# Patient Record
Sex: Male | Born: 1960 | Race: White | Hispanic: No | Smoking: Never smoker
Health system: Southern US, Community
[De-identification: ages and names within clinical notes are randomized; demographics above are authoritative.]

## PROBLEM LIST (undated history)

## (undated) DIAGNOSIS — R519 Headache, unspecified: Secondary | ICD-10-CM

## (undated) DIAGNOSIS — C61 Malignant neoplasm of prostate: Secondary | ICD-10-CM

## (undated) HISTORY — PX: BACK SURGERY: SHX140

## (undated) HISTORY — PX: PROSTATE BIOPSY: SHX241

## (undated) HISTORY — PX: TONSILLECTOMY: SUR1361

---

## 1989-07-04 HISTORY — PX: SURGERY SCROTAL / TESTICULAR: SUR1316

## 2003-01-08 ENCOUNTER — Ambulatory Visit (HOSPITAL_COMMUNITY): Admission: RE | Admit: 2003-01-08 | Discharge: 2003-01-08 | Payer: Self-pay | Admitting: Gastroenterology

## 2003-01-08 ENCOUNTER — Encounter: Payer: Self-pay | Admitting: Gastroenterology

## 2003-02-21 ENCOUNTER — Ambulatory Visit (HOSPITAL_COMMUNITY): Admission: RE | Admit: 2003-02-21 | Discharge: 2003-02-21 | Payer: Self-pay | Admitting: Gastroenterology

## 2003-05-22 ENCOUNTER — Ambulatory Visit (HOSPITAL_COMMUNITY): Admission: RE | Admit: 2003-05-22 | Discharge: 2003-05-23 | Payer: Self-pay | Admitting: Orthopaedic Surgery

## 2008-04-19 ENCOUNTER — Ambulatory Visit (HOSPITAL_COMMUNITY): Admission: RE | Admit: 2008-04-19 | Discharge: 2008-04-20 | Payer: Self-pay | Admitting: Orthopedic Surgery

## 2008-07-04 HISTORY — PX: FRACTURE SURGERY: SHX138

## 2008-08-13 ENCOUNTER — Ambulatory Visit (HOSPITAL_COMMUNITY): Admission: RE | Admit: 2008-08-13 | Discharge: 2008-08-14 | Payer: Self-pay | Admitting: Specialist

## 2009-02-21 ENCOUNTER — Encounter: Admission: RE | Admit: 2009-02-21 | Discharge: 2009-02-21 | Payer: Self-pay | Admitting: Specialist

## 2009-03-12 ENCOUNTER — Encounter (INDEPENDENT_AMBULATORY_CARE_PROVIDER_SITE_OTHER): Payer: Self-pay | Admitting: Specialist

## 2009-03-12 ENCOUNTER — Ambulatory Visit (HOSPITAL_COMMUNITY): Admission: RE | Admit: 2009-03-12 | Discharge: 2009-03-13 | Payer: Self-pay | Admitting: Specialist

## 2010-01-28 IMAGING — CR DG OR PORTABLE SPINE
1 series · 1 of 1 positions shown · non-contrast
Comparison: Single intraoperative view of the lumbar spine at [DATE]
p.m.

CLINICAL DATA: Recurrent HNP at L5 S1.

PORTABLE SPINE

[view not recorded]
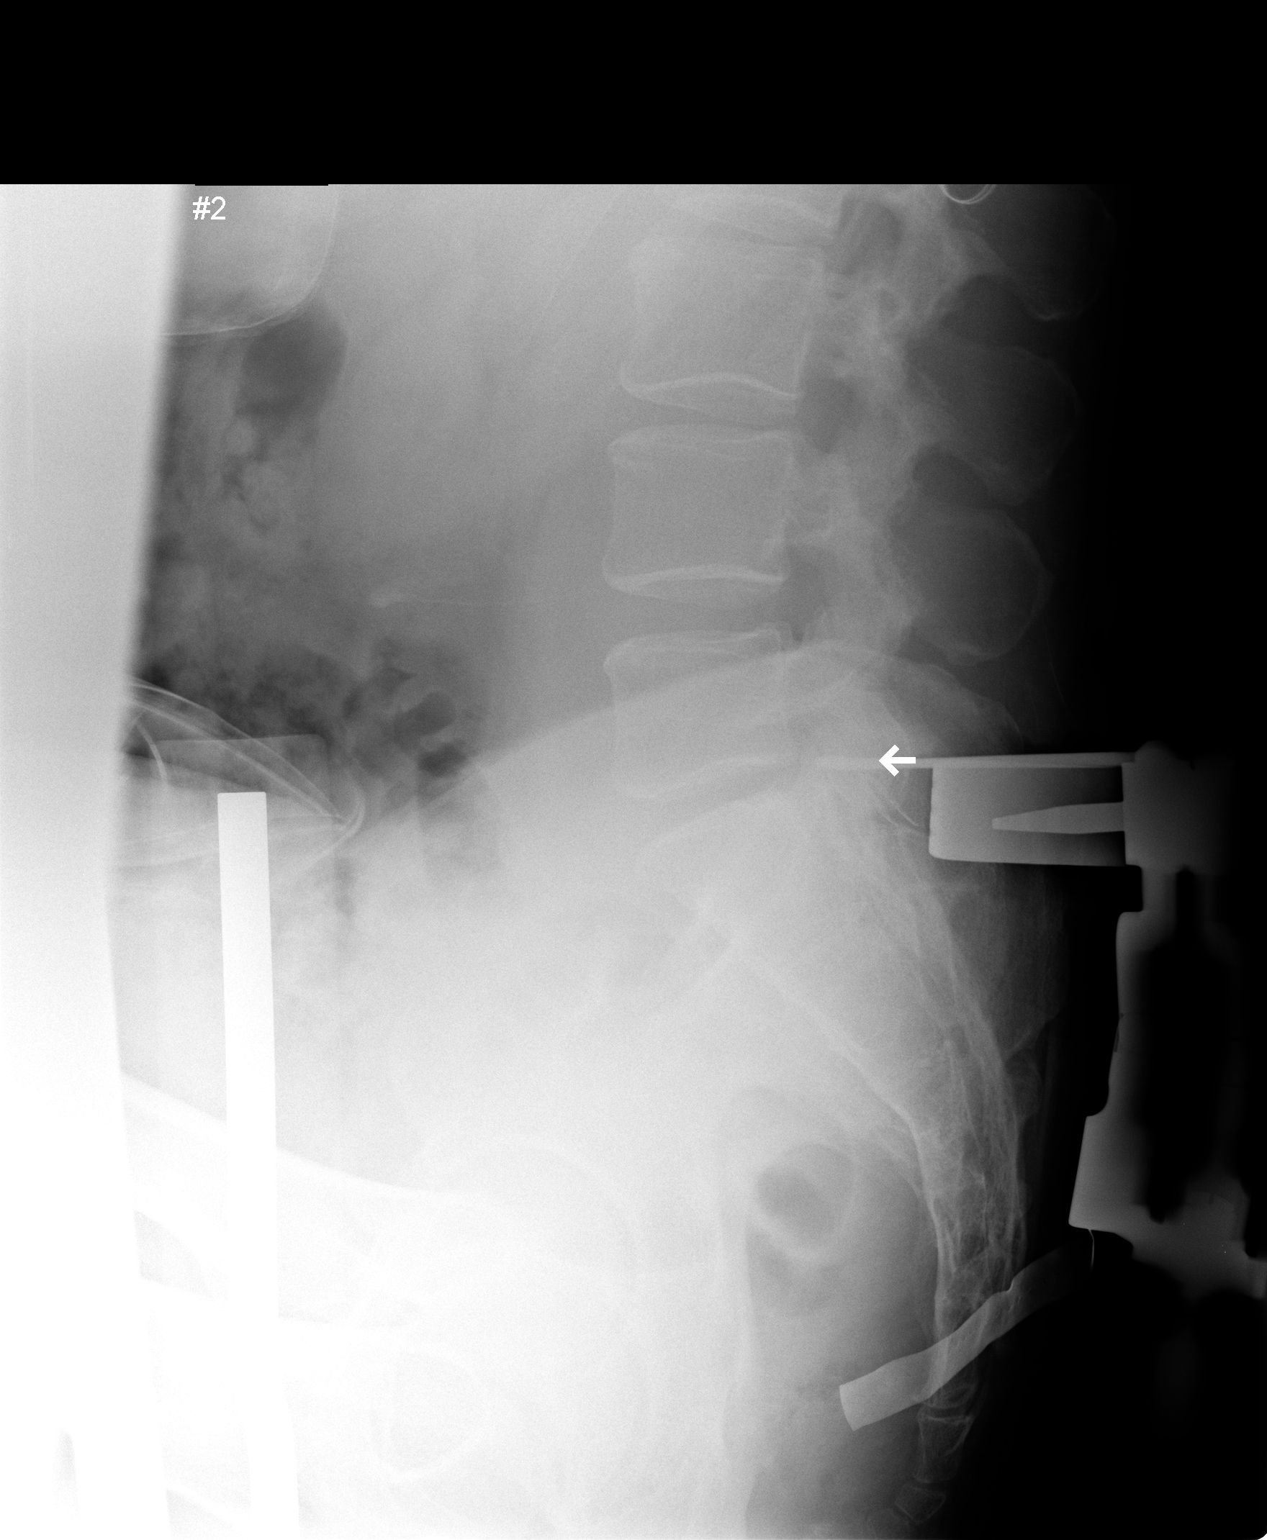

[1 of 1 positions shown; findings below may reference images not displayed]

FINDINGS: A single intraoperative view is noted.  A surgical probe
was directed at the L5-S1 disc level.  Alignment is stable.
IMPRESSION: A surgical probe is directed at the L5-S1 disc level.

## 2010-10-08 LAB — COMPREHENSIVE METABOLIC PANEL
ALT: 72 U/L — ABNORMAL HIGH (ref 0–53)
AST: 43 U/L — ABNORMAL HIGH (ref 0–37)
Albumin: 4.5 g/dL (ref 3.5–5.2)
Alkaline Phosphatase: 84 U/L (ref 39–117)
BUN: 9 mg/dL (ref 6–23)
CO2: 30 mEq/L (ref 19–32)
Calcium: 9.8 mg/dL (ref 8.4–10.5)
Chloride: 103 mEq/L (ref 96–112)
Creatinine, Ser: 0.97 mg/dL (ref 0.4–1.5)
GFR calc Af Amer: >60 mL/min (ref >60)
GFR calc non Af Amer: >60 mL/min (ref >60)
Glucose, Bld: 83 mg/dL (ref 70–99)
Potassium: 4.6 mEq/L (ref 3.5–5.1)
Sodium: 140 mEq/L (ref 135–145)
Total Bilirubin: 0.7 mg/dL (ref 0.3–1.2)
Total Protein: 6.8 g/dL (ref 6.0–8.3)

## 2010-10-08 LAB — CBC
HCT: 48 % (ref 39.0–52.0)
Hemoglobin: 16.6 g/dL (ref 13.0–17.0)
MCHC: 34.6 g/dL (ref 30.0–36.0)
MCV: 100.3 fL — ABNORMAL HIGH (ref 78.0–100.0)
Platelets: 208 10*3/uL (ref 150–400)
RBC: 4.78 MIL/uL (ref 4.22–5.81)
RDW: 13 % (ref 11.5–15.5)
WBC: 6.9 10*3/uL (ref 4.0–10.5)

## 2010-10-08 LAB — URINALYSIS, ROUTINE W REFLEX MICROSCOPIC
Bilirubin Urine: NEGATIVE
Glucose, UA: NEGATIVE mg/dL
Hgb urine dipstick: NEGATIVE
Ketones, ur: NEGATIVE mg/dL
Nitrite: NEGATIVE
Protein, ur: NEGATIVE mg/dL
Specific Gravity, Urine: 1.006 (ref 1.005–1.030)
Urobilinogen, UA: 0.2 mg/dL (ref 0.0–1.0)
pH: 7 (ref 5.0–8.0)

## 2010-10-08 LAB — PROTIME-INR
INR: 1 (ref 0.00–1.49)
Prothrombin Time: 12.7 seconds (ref 11.6–15.2)

## 2010-10-08 LAB — APTT: aPTT: 26 seconds (ref 24–37)

## 2010-10-19 LAB — CBC
HCT: 49.2 % (ref 39.0–52.0)
Hemoglobin: 16.9 g/dL (ref 13.0–17.0)
MCHC: 34.4 g/dL (ref 30.0–36.0)
MCV: 100.5 fL — ABNORMAL HIGH (ref 78.0–100.0)
Platelets: 175 10*3/uL (ref 150–400)
RBC: 4.89 MIL/uL (ref 4.22–5.81)
RDW: 12.9 % (ref 11.5–15.5)
WBC: 5.1 10*3/uL (ref 4.0–10.5)

## 2010-10-19 LAB — URINALYSIS, ROUTINE W REFLEX MICROSCOPIC
Bilirubin Urine: NEGATIVE
Glucose, UA: NEGATIVE mg/dL
Hgb urine dipstick: NEGATIVE
Ketones, ur: NEGATIVE mg/dL
Nitrite: NEGATIVE
Protein, ur: NEGATIVE mg/dL
Specific Gravity, Urine: 1.016 (ref 1.005–1.030)
Urobilinogen, UA: 0.2 mg/dL (ref 0.0–1.0)
pH: 7 (ref 5.0–8.0)

## 2010-10-19 LAB — COMPREHENSIVE METABOLIC PANEL
ALT: 49 U/L (ref 0–53)
AST: 34 U/L (ref 0–37)
Albumin: 4 g/dL (ref 3.5–5.2)
Alkaline Phosphatase: 88 U/L (ref 39–117)
BUN: 9 mg/dL (ref 6–23)
CO2: 27 mEq/L (ref 19–32)
Calcium: 9.2 mg/dL (ref 8.4–10.5)
Chloride: 105 mEq/L (ref 96–112)
Creatinine, Ser: 0.9 mg/dL (ref 0.4–1.5)
GFR calc Af Amer: >60 mL/min (ref >60)
GFR calc non Af Amer: >60 mL/min (ref >60)
Glucose, Bld: 88 mg/dL (ref 70–99)
Potassium: 4.2 mEq/L (ref 3.5–5.1)
Sodium: 140 mEq/L (ref 135–145)
Total Bilirubin: 1.1 mg/dL (ref 0.3–1.2)
Total Protein: 6 g/dL (ref 6.0–8.3)

## 2010-10-19 LAB — APTT: aPTT: 27 seconds (ref 24–37)

## 2010-10-19 LAB — PROTIME-INR
INR: 1 (ref 0.00–1.49)
Prothrombin Time: 13.7 seconds (ref 11.6–15.2)

## 2010-11-16 NOTE — Op Note (Signed)
Derek Fisher, Derek Fisher NO.:  [id number]   MEDICAL RECORD NO.:  [id number]          PATIENT TYPE:  OIB   LOCATION:  0098                         FACILITY:  [facility]   PHYSICIAN:  Jene Every, M.D.    DATE OF BIRTH:  1960/11/17   DATE OF PROCEDURE:  08/13/2008  DATE OF DISCHARGE:                               OPERATIVE REPORT   PREOPERATIVE DIAGNOSES:  Spinal stenosis, herniated nucleus pulposus at  L5-S1, right.   POSTOPERATIVE DIAGNOSES:  Spinal stenosis, herniated nucleus pulposus at  L5-S1, right.   PROCEDURES PERFORMED:  1. Lateral recess decompression, L5-S1.  2. Foraminotomy at S1.  3. Microdiskectomy, L5-S1.   ANESTHESIA:  General.   ASSISTANT:  Roma Schanz, P.A.   BRIEF HISTORY:  [age].  Right lower extremity radiculopathy.  Positive neural tension signs, diminished plantar flexion, numbness in  the S1 nerve root distribution.  MRI indicating disk herniation,  pressing the S1 nerve root, refractory to conservative treatment, is  indicated for decompression.  Risks and benefits discussed, including  bleeding, infection, injury to vascular structures, CSF leakage,  epidural fibrosis, adjacent segment disease, and need for fusion in the  future, anesthetic complications, etc.   TECHNIQUE:  Placed in supine position.  After induction of adequate  anesthesia and 2 grams Kefzol, patient placed prone on the Andrews  frame, all bony prominences well-padded.  Lumbar region was prepped and  draped in usual sterile fashion.  Two 18-gauge spinal needles were  utilized to localize the 5-1 interspace.  This was confirmed with x-ray.  Incision was made over the spinous process of L5-S1.  Subcutaneous  tissue was dissected.  Electrocautery was utilized to achieve  hemostasis.  Dorsolumbar fascia was identified, divided in line with the  skin incision.  Paraspinous muscle elevated from lamina of to 5-1.  Operating microscope was draped and brought onto  the surgical field.  Intraoperative radiograph obtained with a Penfield 4 in the interlaminar  space.  Hemilaminotomy of the cephalad edge of S1 was performed with a 2-  mm Kerrison, detaching ligamentum flavum, protecting neural elements  with a neural patty.  Ligamentum flavum removed from the interspace.  Severe lateral recess stenosis, multifactorial, was noted.  Lateral  recess was decompressed to the medial border pedicle utilizing a 2-mm  Kerrison.  Foraminotomy of S1 was performed.  The S1 nerve was gently  mobilized medially.  A focal HNP was noted.  Annulotomy was performed.  Copious portion of disk material was removed from the disk space with  straight and upbiting pituitary, further mobilized with a hockey stick.  Full diskectomy of herniated material was performed.  There was a least  1 cm of excursion of the S1 nerve root following decompression.  We  checked beneath the thecal sac, the  axilla of the root and shoulder of  the root at the foramen of L5-S1.  There was no disk herniation with  compression of both nerve roots.  Hockey stick probe passed freely up to  the foramen of S1 and 5.  Disk space laminotomy was copiously irrigated  with antibiotic irrigation and inspection  revealed no CSF leakage or  active bleed.   McCullough retractor was removed.  Paraspinous muscles were irrigated  with no evidence of active bleeding.  Thrombin-soaked Gelfoam was placed  in the laminotomy defect.  Dorsolumbar fascia reapproximated with 0  Vicryl simple sutures, subcu with 2-0 Vicryl simple sutures.  Skin was  reapproximated with 4-0 subcuticular Prolene.  Wound reinforced with  Steri-Strips.  Sterile dressing applied, placed supine on hospital bed,  extubated without difficulty and transported to the recovery room in  satisfactory condition.   The patient tolerated the procedure well and there were no  complications.      Jene Every, M.D.  Electronically Signed      JB/MEDQ  D:  08/13/2008  T:  08/13/2008  Job:  [id number]

## 2010-11-16 NOTE — Op Note (Signed)
NAMEMRK, BUZBY NO.:  [id number]   MEDICAL RECORD NO.:  [id number]          PATIENT TYPE:  OIB   LOCATION:  5021                         FACILITY:  [facility]   PHYSICIAN:  Dionne Ano. Gramig, M.D.DATE OF BIRTH:  1961/04/02   DATE OF PROCEDURE:  DATE OF DISCHARGE:                               OPERATIVE REPORT   PREOPERATIVE DIAGNOSES:  Fracture radial head, right elbow with  displacement and articular incongruity and noted associated lateral  ulnar collateral ligament injury.   POSTOPERATIVE DIAGNOSES:  Fracture radial head, right elbow with  displacement and articular incongruity and noted associated lateral  ulnar collateral ligament injury with noted capitellum chondral injury.   SURGICAL PROCEDURES PERFORMED:  1. Open reduction and internal fixation, radial head fracture, right      elbow.  2. Lateral ulnar collateral ligament repair, right elbow.  3. Stress radiography.  4. Arthrotomy with synovectomy and debridement of the capitellum      chondral injury about the elbow.   SURGEON:  Dionne Ano. Amanda Pea, MD   ASSISTANT:  Karie Chimera, PA-C   COMPLICATIONS:  None.   ANESTHESIA:  General.   INDICATIONS FOR THE PROCEDURE:  The patient is a very pleasant male who  presents with the above-mentioned diagnoses.  I have counseled him in  regards to risks and benefits of surgery including risk of infection,  bleeding, anesthesia, damage to normal structures, and failure of  surgery to accomplish its intended goals of relieving symptoms and  restoring function.  With this in mind, he desires to proceed.  All  questions have been encouraged and answered preoperatively.   OPERATIVE PROCEDURE:  The patient was seen by myself, anesthesia.  He  was counseled in the preop area, arm was marked, consent was signed.  All questions were encouraged and answered.  He was taken to operative  suite and underwent smooth induction of general anesthesia.  Following  this, he was placed appropriately, prepped and draped in a sterile  fashion, Betadine scrub and paint.  His arm initially had loss of 30  degrees of extension due to hemarthrosis in the joint.  I did a fluoro  of the arm at this point in time.  I did not see any coronoid or ulna  shaft fracture.  I performed prepping and draping in the usual sterile  fashion.  Sterile field was secured and Kocher incision was made.  This  was done at 250 mmHg of tourniquet control.  Dissection was carried  down.  A tear about the lateral ulnar collateral ligament was noted.  This was later repaired.  We dissected down carefully taking care to  pronate the arm to avoid radial nerve damage and then accessed the  fracture site through an arthrotomy.  I immediately encountered  articular scuffing about the capitellum.  This was debrided.  The  chondral injury was debrided about the capitellum without difficulty.  Synovectomy and removal of the hemarthrosis was performed.  I irrigated  very copiously and removed all bloody clot from the region.  Following  this, I then performed a reduction with a combination of  Freer Web designer.  The patient had reduction of the radial head  fracture accomplished without difficulty followed by fixation with 2  micro Acutrak screws.  Standard AO technique was used for placement  including guide pin placement followed by over reaming the proximal  cortex and then placing the screw.  The patient had excellent purchase,  the screw sat nicely, and this restored his anatomy to excellent  anatomic integrity.  I then irrigated out copiously the joint of greater  than 2 L of saline and noted that the capitellum, radial head fit looked  excellent.  The convexity and concavity and articular apposition  appeared to be excellent.  I then performed a lateral ulnar collateral  ligament repair with FiberWire suture.  A combination of interrupted and  baseball stitch  was used imbricate this region.  He was stress tested  and looked excellent and I was pleased with this.  We then closed the  wound with a running stitch in the fascia followed by subcu being closed  with Vicryl and skin edges with Prolene.  Sensorcaine without  epinephrine was placed in the wound for postop analgesia.  He was placed  in a long-arm splint.  He tolerated the procedure well and there were no  complicating features.   He is going to be monitored quite closely in the postop.  I have placed  him on Indocin as a heterotopic bone prophylaxis and we will plan for  pain management, antibiotics according to our postop protocol, and ask  him to return to the office 10 days after the initial surgery for suture  removal and will graduate him to a therapy program.  We will begin a  gentle interval range of motion.  I want a stress flexion and extension  and make sure that he gets of quickly to moving the elbow.  I have  discussed with him the relevant do's and do not's, etc., and discussed  all issues with his family (wife, Vikki Ports).      Dionne Ano. Amanda Pea, M.D.  Electronically Signed     WMG/MEDQ  D:  04/19/2008  T:  04/20/2008  Job:  [id number]

## 2010-11-19 NOTE — Op Note (Signed)
NAME:  Derek Fisher, Derek Fisher                        ACCOUNT NO.:  [id number]   MEDICAL RECORD NO.:  [id number]                   PATIENT TYPE:  OIB   LOCATION:  5021                                 FACILITY:  [facility]   PHYSICIAN:  Sharolyn Douglas, M.D.                     DATE OF BIRTH:  1960/08/14   DATE OF PROCEDURE:  05/22/2003  DATE OF DISCHARGE:  05/23/2003                                 OPERATIVE REPORT   PREOPERATIVE DIAGNOSIS:  Cervical spondylosis and degenerative disk disease,  C6-7.   POSTOPERATIVE DIAGNOSIS:  Cervical spondylosis and degenerative disk  disease, C6-7.   PROCEDURE:  1. Anterior cervical diskectomy C6-7.  2. Anterior cervical arthrodesis with placement of an 8-mm NuVasive     allograft prosthesis spacer packed with local autologous bone graft.  3. Anterior cervical plating utilizing the Trinica system at C6-7.   SURGEON:  Sharolyn Douglas, M.D.   ASSISTANT:  [name], P.A.   ANESTHESIA:  General endotracheal anesthesia.   COMPLICATIONS:  None.   INDICATIONS FOR PROCEDURE:  The patient is a 50 year old male with a long  history of progressively worsening neck  and right shoulder pain. Plain  radiographs show severe degenerative changes, most pronounced at C6-7 with  disk space narrowing and large osteophyte formation with local kyphosis.  There are facet degenerative changes at C4-5 and C5-6. His MRI scan again  demonstrated multilevel degenerative changes with severe  disk  space  narrowing and osteophyte formation at C6-7. There was a broad-based disk  protrusion which is in the right posterolateral position  with moderate  spinal stenosis and slight deformity of  the cord. There is severe foraminal  narrowing right greater than left. Because of his refractory symptoms  unresponsive to conservative treatment, he has elected to undergo ACDF at C6-  7 with hopes of improving  his symptoms. He understands that he has  significant degenerative changes at the  adjacent segments which may be an  issue in the future.   DESCRIPTION OF PROCEDURE:  The patient was properly identified in the  holding area  and taken to the operating room. He underwent  general  endotracheal anesthesia without difficulty. He was given prophylactic IV  antibiotics. He was carefully positioned on the operating table with the  Mayfield head  rest. His neck  was placed in slight extension. Then 5 pounds  of halter traction was applied. The neck was prepped and draped in the usual  sterile fashion.   A 4-cm incision was made on the left side at the level of the cricoid  cartilage. Dissection was carried down sharply through the platysma. The  interval between the SCM and the strap muscles was developed down to the  prevertebral space. The C6-7 level was easily identified  by the large  anterior osteophytes. A spinal needle  was placed and intraoperative x-ray  confirmed our location. The esophagus,  trachea and carotid sheath were  identified  and protected at all times. The longus coli muscle was elevated  out over the C6-7 disk space bilaterally. The deep Shadowline retractor was  placed.   A Leksell rongeur was used to remove the very large anterior osteophyte. The  diskectomy was carried back to the posterior longitudinal ligament. The disk  material was very degenerative. There were large uncovertebral spurs and the  disk  space was quite narrowed. Caspar distraction pins were placed and  distraction was applied across the C6-7 interspace.   The microscope was draped and brought into the field. High bur  use removed  the cartilaginous endplate as well as take down the uncovertebral joints. A  2-mm Kerrison was used to undercut the vertebral margins at C6 and 7.  Foraminotomies were carried out bilaterally. We found the foramen to be  quite tight secondary to osteophyte overgrowth. A blunt probe was used to  confirm the foramen was patent. Bleeding was controlled  with bipolar  electrocautery and Gelfoam.   We then placed an 8-mm NuVasive allograft prosthesis spacer which had been  packed with local autogenous bone graft collected from the osteophytes as  well as bur shavings. The graft  was carefully countersunk 2 mm. We  confirmed there was space posteriorly with the blunt probe. We then placed  the 26-mm Trinica plate with four 14-mm screws. We had excellent screw  purchase. We confirmed the locking mechanism was engaged.   The wound was irrigated. The esophagus, trachea and carotid sheath were  inspected. There were no apparent injuries. A deep Penrose drain was placed.  The platysma was closed with a 2-0 Vicryl followed  by a 3-0 Vicryl in the  subcutaneous layer and then a running 4-0 Vicryl subcuticular suture  to  reapproximate the skin edges. Benzoin and Steri-Strips were placed. A  sterile dressing was applied. A soft collar was placed.   The patient was extubated without difficulty and transported to the recovery  room in stable condition. He was able to move his upper  and lower  extremities.                                               Sharolyn Douglas, M.D.    MC/MEDQ  D:  05/22/2003  T:  05/23/2003  Job:  [id number]

## 2010-11-19 NOTE — Op Note (Signed)
   NAME:  Derek Fisher, Derek Fisher                        ACCOUNT NO.:  [id number]   MEDICAL RECORD NO.:  [id number]                   PATIENT TYPE:  AMB   LOCATION:  ENDO                                 FACILITY:  [facility]   PHYSICIAN:  Anselmo Rod, M.D.               DATE OF BIRTH:  [birth year]   DATE OF PROCEDURE:  02/21/2003  DATE OF DISCHARGE:                                 OPERATIVE REPORT   PROCEDURE PERFORMED:  Screening colonoscopy.   ENDOSCOPIST:  Anselmo Rod, M.D.   INSTRUMENT USED:  Olympus video colonoscope.   INDICATIONS FOR PROCEDURE:  Family history of colon cancer in a 50 year old  white male, rule out colonic polyps, masses, etc.   PREPROCEDURE PREPARATION:  Informed consent was procured from the patient.  The patient was fasted for eight hours prior to the procedure and prepped  with a bottle of magnesium citrate and a gallon of GoLYTELY the night prior  to the procedure.   PREPROCEDURE PHYSICAL EXAMINATION:  VITAL SIGNS:  The patient with stable  vital signs.  NECK:  Supple.  CHEST:  Clear to auscultation.  CARDIAC:  S1, S2, regular.  ABDOMEN:  Soft with normal bowel sounds.   DESCRIPTION OF THE PROCEDURE:  The patient was placed in the left lateral  decubitus position, sedated with 70 mg of Demerol and 7 mg of Versed  intravenously.  Once the patient was adequately sedated and maintained on  low-flow oxygen and continuous cardiac monitoring, the Olympus video  colonoscope was advanced from the rectum to the cecum and terminal ileum  without difficulty.  The entire exam was normal.  No masses, polyps,  erosions, ulcerations, or diverticula were seen.  Appendiceal orifice and  the ileocecal valve were clearly visualized and photographed.  The terminal  ileum appeared normal.  Retroflexion in the rectum revealed no  abnormalities.   IMPRESSION:  Normal colonoscopy up to the terminal ileum.                  RECOMMENDATIONS:  1. Repeat  colorectal screening in the next five years unless the patient     develops any abnormal symptoms in the     interim.  2. Outpatient follow-up as need arises in the future.  3. Continue on high fiber diet with liberal fluid intake.                                               Anselmo Rod, M.D.    JNM/MEDQ  D:  02/21/2003  T:  02/21/2003  Job:  [id number]   cc:   Gloriajean Dell. Andrey Campanile, M.D.  [address]  [place]  [place] [address]  Fax: [phone]

## 2010-11-19 NOTE — H&P (Signed)
NAME:  Derek Fisher, Derek Fisher NO.:  [id number]   MEDICAL RECORD NO.:  [id number]                   PATIENT TYPE:  OIB   LOCATION:                                       FACILITY:  [facility]   PHYSICIAN:  Sharolyn Douglas, M.D.                     DATE OF BIRTH:  June 18, 1961   DATE OF ADMISSION:  05/22/2003  DATE OF DISCHARGE:                                HISTORY & PHYSICAL   CHIEF COMPLAINT:  Neck and bilateral upper extremity pain and paresthesias,  right worse than left.   HISTORY OF PRESENT ILLNESS:  The patient is a 50 year old male with neck  pain and bilateral upper extremity pain and paresthesias for a number of  years now.  He has failed numerous conservative management including anti-  inflammatory medications, narcotic pain medications, activity modification,  physical therapy, as well as injections.  Unfortunately, he has failed to  improve.  He continues to have pain that limits his activities as well as  quality of life.  Secondary to these findings as well as findings on MRI, it  is felt his best course of management would be an anterior cervical  diskectomy and fusion at C6-7.  Risks and benefits of this procedure were  discussed with the patient by Dr. Sharolyn Douglas as well as myself.  He indicated  understanding and opted to proceed.   ALLERGIES:  No known drug allergies.   MEDICATIONS:  Over-the-counter ibuprofen.   PAST SURGICAL HISTORY:  1. Cyst removed from wrist.  2. Hernia repair.   PAST MEDICAL HISTORY:  History of migraines.   SOCIAL HISTORY:  The patient denies tobacco use.  He has one drink two to  three times per week.  He is married, has two children, ages 4 and 41.  His  wife will be available to help postoperatively.   FAMILY MEDICAL HISTORY:  Father alive at age 65 with diabetes, Parkinson's,  and Alzheimer's.  Mother alive at age 5 and healthy.   REVIEW OF SYSTEMS:  The patient denies any fevers, chills, bleeding  tendencies,  CNS signs, blurred vision, double vision, seizures, headaches,  paralysis.  CARDIOVASCULAR:  Denies chest pain, angina, orthopnea,  claudication, palpitations.  PULMONARY:  Denies shortness of breath,  productive cough, or hemoptysis.  GI:  Denies nausea, vomiting,  constipation, diarrhea, melena, bloody stools.  GU: Denies dysuria,  hematuria, discharge.  MUSCULOSKELETAL:  As per HPI.   PHYSICAL EXAMINATION:  VITAL SIGNS:  Blood pressure 126/76, respirations 16  and unlabored, pulse 80 and regular.  GENERAL:  The patient is a 50 year old white male who is alert and oriented  in no acute distress.  Well-nourished, well-developed, appears stated age.  Pleasant and cooperative with exam.  HEENT:  Head is normocephalic and atraumatic.  Pupils equal, round, and  reactive.  Extraocular movements intact.  Nares patent, pharynx clear.  NECK:  Soft  to palpation.  No lymphadenopathy, thyromegaly, or bruits  appreciated.  CHEST:  Clear to auscultation bilaterally.  No rales, rhonchi, sternal  wheezes, friction rubs.  BREASTS:  Not pertinent and not performed.  HEART:  S1, S2.  Regular rate and rhythm.  No murmurs, gallops, or rubs.  ABDOMEN:  Soft to palpation.  Positive bowel sounds.  Nontender,  nondistended.  No organomegaly noted.  GU:  Not pertinent, not performed.  EXTREMITIES:  The patient's motor function and sensation are grossly intact.  Pulses are intact and symmetric.  SKIN:  Intact without lesions or rashes.   LABORATORY DATA:  X-rays show degenerative disk disease of cervical spine,  worse at C6-7.   IMPRESSION:  C6-7 spondylosis.   PLAN:  Admit to [facility] November 18,2004, for anterior cervical  diskectomy and fusion at C6-7 to be done by Dr. Sharolyn Douglas.      Derek Fisher, P.A.                       Sharolyn Douglas, M.D.    CM/MEDQ  D:  05/22/2003  T:  05/22/2003  Job:  [id number]

## 2011-04-04 LAB — URINALYSIS, ROUTINE W REFLEX MICROSCOPIC
Bilirubin Urine: NEGATIVE
Hgb urine dipstick: NEGATIVE
Ketones, ur: NEGATIVE
Nitrite: NEGATIVE
Specific Gravity, Urine: 1.031 — ABNORMAL HIGH
pH: 7.5

## 2011-04-04 LAB — CBC
HCT: 49.3
Hemoglobin: 16.9
MCV: 100.6 — ABNORMAL HIGH
Platelets: 182
RDW: 12.7
WBC: 4.6

## 2011-04-04 LAB — BASIC METABOLIC PANEL
BUN: 9
Chloride: 108
Glucose, Bld: 83
Potassium: 4.2
Sodium: 140

## 2016-04-14 ENCOUNTER — Other Ambulatory Visit: Payer: Self-pay

## 2016-07-04 HISTORY — PX: COLONOSCOPY: SHX174

## 2017-08-28 DIAGNOSIS — R059 Cough, unspecified: Secondary | ICD-10-CM | POA: Insufficient documentation

## 2017-08-28 DIAGNOSIS — J069 Acute upper respiratory infection, unspecified: Secondary | ICD-10-CM | POA: Insufficient documentation

## 2018-12-25 DIAGNOSIS — M545 Low back pain, unspecified: Secondary | ICD-10-CM | POA: Insufficient documentation

## 2019-10-18 ENCOUNTER — Ambulatory Visit: Payer: Self-pay

## 2019-10-18 ENCOUNTER — Ambulatory Visit: Payer: Self-pay | Admitting: Radiation Oncology

## 2019-11-04 NOTE — Progress Notes (Signed)
GU Location of Tumor / Histology: prostatic adenocarcinoma  If Prostate Cancer, Gleason Score is (3 + 4) and PSA is (5.29). Prostate volume: 40 mL.  Derek Fisher presented      months ago with signs/symptoms of:     Biopsies of prostate (if applicable) revealed:   Past/Anticipated interventions by urology, if any: prostate biopsy, referral to Dr. Tammi Klippel to discuss radiation therapy options, patient leaning toward surgery.  Past/Anticipated interventions by medical oncology, if any: no  Weight changes, if any:   Bowel/Bladder complaints, if any: Reports dysuria.   Nausea/Vomiting, if any: no  Pain issues, if any:    SAFETY ISSUES:  Prior radiation?   Pacemaker/ICD?   Possible current pregnancy? no, male patient  Is the patient on methotrexate? no  Current Complaints / other details:  59 year old male. Divorced. Works in [employer] for [employer]. Former smoker (stopped in 1987).   Leaning toward surgery.

## 2019-11-05 ENCOUNTER — Other Ambulatory Visit: Payer: Self-pay

## 2019-11-05 ENCOUNTER — Telehealth: Payer: Self-pay | Admitting: Radiation Oncology

## 2019-11-05 ENCOUNTER — Ambulatory Visit
Admission: RE | Admit: 2019-11-05 | Discharge: 2019-11-05 | Disposition: A | Discharge status: Home / Self Care | Payer: Commercial Managed Care - PPO | Source: Ambulatory Visit | Attending: Radiation Oncology | Admitting: Radiation Oncology

## 2019-11-05 ENCOUNTER — Encounter: Payer: Self-pay | Admitting: Radiation Oncology

## 2019-11-05 VITALS — Ht 74.0 in | Wt 205.0 lb

## 2019-11-05 DIAGNOSIS — C61 Malignant neoplasm of prostate: Secondary | ICD-10-CM

## 2019-11-05 HISTORY — DX: Malignant neoplasm of prostate: C61

## 2019-11-05 NOTE — Telephone Encounter (Signed)
Patient scheduled for nurse eval at 1230 and consult at 1300 with Dr. Tammi Klippel. Attempted several times to reach patient on his cell and home number. Phoned numbers on referral match numbers in system. Only able to leave a message on the patient's cell phone. Left two voicemail messages spaced approximately ten minutes apart encouraging a call back. Provided patient with my direct number in the voicemail. Informed providers.

## 2019-11-05 NOTE — Progress Notes (Signed)
Radiation Oncology         [phone] ________________________________  Initial Outpatient Consultation - Conducted via Telephone due to current COVID-19 concerns for limiting patient exposure  Name: Derek Fisher MRN: [id number]  Date: 11/05/2019  DOB: 1960/12/16  [id number], No Pcp Per  Alexis Frock, MD   REFERRING PHYSICIAN: Alexis Frock, MD  DIAGNOSIS: 59 y.o. gentleman with Stage T1c adenocarcinoma of the prostate with Gleason score of 3+4, and PSA of 5.29.    ICD-10-CM   1. Malignant neoplasm of prostate ([place])  C61     HISTORY OF PRESENT ILLNESS: Derek Fisher is a 59 y.o. male with a diagnosis of prostate cancer. He has a history of elevated PSA of 4.88 with induration noted on DRE in 12/2012. He was placed on bactrim for a month, and the PSA responded accordingly, returning to normal at 3.19 in 02/2013. Since that time, he has continued in follow up with Dr. Tresa Moore, and his PSA has remained in the normal range between 3-3.5.   However, more recently, his PSA rose to 5.38 in 06/2019 and remained elevated at 5.29 when repeated 08/14/2019.  A digital rectal examination was performed at that time revealing slight right induration without nodularity.  The patient proceeded to transrectal ultrasound with 12 biopsies of the prostate on 09/30/2019.  The prostate volume measured 40 cc.  Out of 12 core biopsies, 3 were positive.  The maximum Gleason score was 3+4, and this was seen in the left apex. Additionally, Gleason 3+3 was seen in the right base (small focus) and right mid lateral.  The patient reviewed the biopsy results with his urologist and he has kindly been referred today for discussion of potential radiation treatment options.  PREVIOUS RADIATION THERAPY: No  PAST MEDICAL HISTORY:  Past Medical History:  Diagnosis Date  . Prostate cancer ([place])       PAST SURGICAL HISTORY: Past Surgical History:  Procedure Laterality Date  . PROSTATE BIOPSY      FAMILY  HISTORY:  Family History  Problem Relation Age of Onset  . Breast cancer Neg Hx   . Prostate cancer Neg Hx   . Pancreatic cancer Neg Hx   . Colon cancer Neg Hx     SOCIAL HISTORY:  Social History   Socioeconomic History  . Marital status: Legally Separated    Spouse name: Not on file  . Number of children: Not on file  . Years of education: Not on file  . Highest education level: Not on file  Occupational History  . Not on file  Tobacco Use  . Smoking status: Never Smoker  . Smokeless tobacco: Never Used  Substance and Sexual Activity  . Alcohol use: Not Currently  . Drug use: Never  . Sexual activity: Yes  Other Topics Concern  . Not on file  Social History Narrative  . Not on file   Social Determinants of Health   Financial Resource Strain:   . Difficulty of Paying Living Expenses:   Food Insecurity:   . Worried About Charity fundraiser in the Last Year:   . Arboriculturist in the Last Year:   Transportation Needs:   . Film/video editor (Medical):   Marland Kitchen Lack of Transportation (Non-Medical):   Physical Activity:   . Days of Exercise per Week:   . Minutes of Exercise per Session:   Stress:   . Feeling of Stress :   Social Connections:   . Frequency of Communication with  Friends and Family:   . Frequency of Social Gatherings with Friends and Family:   . Attends Religious Services:   . Active Member of Clubs or Organizations:   . Attends Archivist Meetings:   Marland Kitchen Marital Status:   Intimate Partner Violence:   . Fear of Current or Ex-Partner:   . Emotionally Abused:   Marland Kitchen Physically Abused:   . Sexually Abused:     ALLERGIES: Patient has no known allergies.  MEDICATIONS:  Current Outpatient Medications  Medication Sig Dispense Refill  . cetirizine (ZYRTEC) 10 MG tablet Take by mouth.    . Loratadine-Pseudoephedrine (CLARITIN-D 12 HOUR PO) Claritin-D    . meloxicam (MOBIC) 15 MG tablet Take 15 mg by mouth daily.    . Naproxen Sodium (ALEVE  PO) Aleve    . tadalafil, PAH, (ADCIRCA) 20 MG tablet SMARTSIG:1 Tablet(s) By Mouth Every 3 Days PRN     No current facility-administered medications for this encounter.    REVIEW OF SYSTEMS:  On review of systems, the patient reports that he is doing well overall. He denies any chest pain, shortness of breath, cough, fevers, chills, night sweats, unintended weight changes. He denies any bowel disturbances, and denies abdominal pain, nausea or vomiting. He denies any new musculoskeletal or joint aches or pains. He reports minimal urinary symptoms without bother. He does have moderate erectile dysfunction, managed with Cialis. A complete review of systems is obtained and is otherwise negative.  PHYSICAL EXAM:  Wt Readings from Last 3 Encounters:  11/05/19 205 lb (93 kg)   Temp Readings from Last 3 Encounters:  No data found for Temp   BP Readings from Last 3 Encounters:  No data found for BP   Pulse Readings from Last 3 Encounters:  No data found for Pulse   Pain Assessment Pain Score: 0-No pain/10  Physical exam not performed in light of telephone consult visit format.   KPS = 100  100 - Normal; no complaints; no evidence of disease. 90   - Able to carry on normal activity; minor signs or symptoms of disease. 80   - Normal activity with effort; some signs or symptoms of disease. 58   - Cares for self; unable to carry on normal activity or to do active work. 60   - Requires occasional assistance, but is able to care for most of his personal needs. 50   - Requires considerable assistance and frequent medical care. 64   - Disabled; requires special care and assistance. 86   - Severely disabled; hospital admission is indicated although death not imminent. 41   - Very sick; hospital admission necessary; active supportive treatment necessary. 10   - Moribund; fatal processes progressing rapidly. 0     - Dead  Karnofsky DA, Abelmann [place], Craver LS and Burchenal [facility] [phone]) The use of  the nitrogen mustards in the palliative treatment of carcinoma: with particular reference to bronchogenic carcinoma Cancer 1 [phone]  LABORATORY DATA:  Lab Results  Component Value Date   WBC 6.9 03/10/2009   HGB 16.6 03/10/2009   HCT 48.0 03/10/2009   MCV 100.3 (H) 03/10/2009   PLT 208 03/10/2009   Lab Results  Component Value Date   NA 140 03/10/2009   K 4.6 03/10/2009   CL 103 03/10/2009   CO2 30 03/10/2009   Lab Results  Component Value Date   ALT 72 (H) 03/10/2009   AST 43 (H) 03/10/2009   ALKPHOS 84 03/10/2009   BILITOT 0.7 03/10/2009  RADIOGRAPHY: No results found.    IMPRESSION/PLAN: This visit was conducted via Telephone to spare the patient unnecessary potential exposure in the healthcare setting during the current COVID-19 pandemic. 1. 59 y.o. gentleman with Stage T1c adenocarcinoma of the prostate with Gleason Score of 3+4, and PSA of 5.29. We discussed the patient's workup and outlined the nature of prostate cancer in this setting. The patient's T stage, Gleason's score, and PSA put him into the favorable intermediate risk group. Accordingly, he is eligible for a variety of potential treatment options including brachytherapy, 5.5 weeks of external radiation, or prostatectomy. We discussed the available radiation techniques, and focused on the details and logistics of delivery. We discussed and outlined the risks, benefits, short and long-term effects associated with radiotherapy and compared and contrasted these with prostatectomy. We discussed the role of SpaceOAR in reducing the rectal toxicity associated with radiotherapy.    The patient focused most of his questions and interest in robotic-assisted laparoscopic radical prostatectomy. We discussed some of the potential advantages of surgery including surgical staging, the availability of salvage radiotherapy to the prostatic fossa, and the confidence associated with immediate biochemical response.  We also  discussed some of the potential proven indications for postoperative radiotherapy including positive margins, extracapsular extension, and seminal vesicle involvement. We talked about some of the other potential findings leading to a recommendation for radiotherapy including a non-zero postoperative PSA and positive lymph nodes. He was encouraged to ask questions that were answered to his stated satisfaction.  At the end of the conversation, the patient is leaning towards prostatectomy but has not made his final decision. We will share our discussion with Dr. Tresa Moore He plans to continue to give further consideration to his options and will meet back with Dr. Tresa Moore for further discussion before making his final decision. We wished him the best, and we look forward to following along in his progress. Of course, we are more than happy to continue to participate in his care should he elect to proceed with radiotherapy now or have any clinical indication in the future.  Given current concerns for patient exposure during the COVID-19 pandemic, this encounter was conducted via telephone. The patient was notified in advance and was offered a MyChart meeting to allow for face to face communication but unfortunately reported that he did not have the appropriate resources/technology to support such a visit and instead preferred to proceed with telephone consult. The patient has given verbal consent for this type of encounter. The time spent during this encounter was 60 minutes. The attendants for this meeting include Tyler Pita MD, Ashlyn Bruning PA-C, [place], and patient, Derek Fisher. During the encounter, Tyler Pita MD, Ashlyn Bruning PA-C, and scribe, Wilburn Mylar were located at [place].  Patient, Derek Fisher was located at home.    Nicholos Johns, PA-C    Tyler Pita, MD  [facility] Direct Dial: [phone]  Fax: [phone] [email or web address]  Skype  LinkedIn  This document serves as a record of services personally performed by Tyler Pita, MD and Freeman Caldron, PA-C. It was created on their behalf by Wilburn Mylar, a trained medical scribe. The creation of this record is based on the scribe's personal observations and the provider's statements to them. This document has been checked and approved by the attending provider.

## 2019-11-05 NOTE — Progress Notes (Signed)
See progress note under physician encounter. 

## 2019-11-12 ENCOUNTER — Encounter: Payer: Self-pay | Admitting: Medical Oncology

## 2019-11-12 NOTE — Progress Notes (Signed)
Spoke with patient to introduce myself as the prostate nurse navigator and discuss my role. I was unable to meet him 5/4, when he consulted with Dr. Tammi Klippel. He states the consult went well. After much thought, he is going with prostatectomy.  He has an appointment with Dr. Tresa Moore this afternoon to further discuss surgery. He feels he is young and this would be the best option, knowing he can get radiation in the future if needed. I wished him well  and asked him to reach out, if we can assist him in the future. He was very appreciative of the call and voiced understanding.Ashlyn,PA notified of patient's decision.

## 2019-11-14 ENCOUNTER — Other Ambulatory Visit: Payer: Self-pay | Admitting: Urology

## 2019-11-28 NOTE — Patient Instructions (Addendum)
DUE TO COVID-19 ONLY ONE VISITOR IS ALLOWED TO COME WITH YOU AND STAY IN THE WAITING ROOM ONLY DURING PRE OP AND PROCEDURE DAY OF SURGERY. THE 2 VISITORS  MAY VISIT WITH YOU AFTER SURGERY IN YOUR PRIVATE ROOM DURING VISITING HOURS ONLY!  YOU NEED TO HAVE A COVID 19 TEST ON_6/5______ @_11 :05______, THIS TEST MUST BE DONE BEFORE SURGERY, COME  [address], [place] , [address].  ([place]) ONCE YOUR COVID TEST IS COMPLETED, PLEASE BEGIN THE QUARANTINE INSTRUCTIONS AS OUTLINED IN YOUR HANDOUT.                Derek Fisher    Your procedure is scheduled on: 12/11/19   Report to [facility] Main  Entrance   Report to admitting at  10:00 Am     Call this number if you have problems the morning of surgery [phone]    Remember: Do not eat food after Midnight.  Clear liquid until 6:00 AM   BRUSH YOUR TEETH MORNING OF SURGERY AND RINSE YOUR MOUTH OUT, NO CHEWING GUM CANDY OR MINTS.     Take these medicines the morning of surgery with A SIP OF WATER: Claritin if needed                                 You may not have any metal on your body including              piercings  Do not wear jewelry, lotions, powders or  deodorant                Men may shave face and neck.   Do not bring valuables to the hospital. [place].  Contacts, dentures or bridgework may not be worn into surgery.        Special Instructions: N/A              Please read over the following fact sheets you were given: _____________________________________________________________________             [facility] - Preparing for Surgery Before surgery, you can play an important role.   Because skin is not sterile, your skin needs to be as free of germs as possible.   You can reduce the number of germs on your skin by washing with CHG (chlorahexidine gluconate) soap before surgery.   CHG is an antiseptic cleaner which kills  germs and bonds with the skin to continue killing germs even after washing. Please DO NOT use if you have an allergy to CHG or antibacterial soaps.  If your skin becomes reddened/irritated stop using the CHG and inform your nurse when you arrive at Short Stay.   You may shave your face/neck.  Please follow these instructions carefully:  1.  Shower with CHG Soap the night before surgery and the  morning of Surgery.  2.  If you choose to wash your hair, wash your hair first as usual with your  normal  shampoo.  3.  After you shampoo, rinse your hair and body thoroughly to remove the  shampoo.  4.  Use CHG as you would any other liquid soap.  You can apply chg directly  to the skin and wash                       Gently with a scrungie or clean washcloth.  5.  Apply the CHG Soap to your body ONLY FROM THE NECK DOWN.   Do not use on face/ open                           Wound or open sores. Avoid contact with eyes, ears mouth and genitals (private parts).                       Wash face,  Genitals (private parts) with your normal soap.             6.  Wash thoroughly, paying special attention to the area where your surgery  will be performed.  7.  Thoroughly rinse your body with warm water from the neck down.  8.  DO NOT shower/wash with your normal soap after using and rinsing off  the CHG Soap.                9.  Pat yourself dry with a clean towel.            10.  Wear clean pajamas.            11.  Place clean sheets on your bed the night of your first shower and do not  sleep with pets. Day of Surgery : Do not apply any lotions/deodorants the morning of surgery.  Please wear clean clothes to the hospital/surgery center.  FAILURE TO FOLLOW THESE INSTRUCTIONS MAY RESULT IN THE CANCELLATION OF YOUR SURGERY PATIENT SIGNATURE_________________________________  NURSE  SIGNATURE__________________________________  ________________________________________________________________________

## 2019-11-29 ENCOUNTER — Encounter (HOSPITAL_COMMUNITY): Payer: Self-pay

## 2019-11-29 ENCOUNTER — Encounter (HOSPITAL_COMMUNITY)
Admission: RE | Admit: 2019-11-29 | Discharge: 2019-11-29 | Disposition: A | Discharge status: Home / Self Care | Payer: Commercial Managed Care - PPO | Source: Ambulatory Visit | Attending: Urology | Admitting: Urology

## 2019-11-29 ENCOUNTER — Other Ambulatory Visit: Payer: Self-pay

## 2019-11-29 DIAGNOSIS — Z01812 Encounter for preprocedural laboratory examination: Secondary | ICD-10-CM | POA: Insufficient documentation

## 2019-11-29 HISTORY — DX: Headache, unspecified: R51.9

## 2019-11-29 LAB — TYPE AND SCREEN
ABO/RH(D): O POS
Antibody Screen: NEGATIVE

## 2019-11-29 LAB — CBC
HCT: 46.5 % (ref 39.0–52.0)
Hemoglobin: 16.5 g/dL (ref 13.0–17.0)
MCH: 35.3 pg — ABNORMAL HIGH (ref 26.0–34.0)
MCHC: 35.5 g/dL (ref 30.0–36.0)
MCV: 99.4 fL (ref 80.0–100.0)
Platelets: 253 10*3/uL (ref 150–400)
RBC: 4.68 MIL/uL (ref 4.22–5.81)
RDW: 13.1 % (ref 11.5–15.5)
WBC: 7.7 10*3/uL (ref 4.0–10.5)
nRBC: 0 % (ref 0.0–0.2)

## 2019-11-29 LAB — ABO/RH: ABO/RH(D): O POS

## 2019-11-29 NOTE — Progress Notes (Signed)
COVID Vaccine Completed:yes Date COVID Vaccine completed:11/06/19 COVID vaccine manufacturer:   [place]   PCP - [facility] PA Cardiologist - no  Chest x-ray - no EKG - no Stress Test - no ECHO - no Cardiac Cath - no  Sleep Study - no CPAP -   Fasting Blood Sugar - NA Checks Blood Sugar _____ times a day  Blood Thinner Instructions:NA Aspirin Instructions: Last Dose:  Anesthesia review:   Patient denies shortness of breath, fever, cough and chest pain at PAT appointment  yes Patient verbalized understanding of instructions that were given to them at the PAT appointment. Patient was also instructed that they will need to review over the PAT instructions again at home before surgery. Yes

## 2019-12-07 ENCOUNTER — Other Ambulatory Visit (HOSPITAL_COMMUNITY)
Admission: RE | Admit: 2019-12-07 | Discharge: 2019-12-07 | Disposition: A | Discharge status: Home / Self Care | Payer: Commercial Managed Care - PPO | Source: Ambulatory Visit | Attending: Urology | Admitting: Urology

## 2019-12-07 DIAGNOSIS — Z01812 Encounter for preprocedural laboratory examination: Secondary | ICD-10-CM | POA: Insufficient documentation

## 2019-12-07 DIAGNOSIS — Z20822 Contact with and (suspected) exposure to covid-19: Secondary | ICD-10-CM | POA: Insufficient documentation

## 2019-12-07 LAB — SARS CORONAVIRUS 2 (TAT 6-24 HRS): SARS Coronavirus 2: NEGATIVE

## 2019-12-11 ENCOUNTER — Other Ambulatory Visit: Payer: Self-pay

## 2019-12-11 ENCOUNTER — Encounter (HOSPITAL_COMMUNITY)
Admission: RE | Disposition: A | Discharge status: Home / Self Care | Payer: Self-pay | Source: Home / Self Care | Attending: Urology

## 2019-12-11 ENCOUNTER — Ambulatory Visit (HOSPITAL_COMMUNITY): Payer: Commercial Managed Care - PPO | Admitting: Certified Registered"

## 2019-12-11 ENCOUNTER — Telehealth: Payer: Self-pay | Admitting: Urology

## 2019-12-11 ENCOUNTER — Encounter (HOSPITAL_COMMUNITY): Payer: Self-pay | Admitting: Urology

## 2019-12-11 ENCOUNTER — Observation Stay (HOSPITAL_COMMUNITY)
Admission: RE | Admit: 2019-12-11 | Discharge: 2019-12-12 | Disposition: A | Discharge status: Home / Self Care | Payer: Commercial Managed Care - PPO | Attending: Urology | Admitting: Urology

## 2019-12-11 DIAGNOSIS — Z79899 Other long term (current) drug therapy: Secondary | ICD-10-CM | POA: Insufficient documentation

## 2019-12-11 DIAGNOSIS — M1990 Unspecified osteoarthritis, unspecified site: Secondary | ICD-10-CM | POA: Insufficient documentation

## 2019-12-11 DIAGNOSIS — Z86010 Personal history of colonic polyps: Secondary | ICD-10-CM | POA: Diagnosis not present

## 2019-12-11 DIAGNOSIS — F329 Major depressive disorder, single episode, unspecified: Secondary | ICD-10-CM | POA: Insufficient documentation

## 2019-12-11 DIAGNOSIS — F419 Anxiety disorder, unspecified: Secondary | ICD-10-CM | POA: Diagnosis not present

## 2019-12-11 DIAGNOSIS — C61 Malignant neoplasm of prostate: Secondary | ICD-10-CM | POA: Diagnosis not present

## 2019-12-11 HISTORY — PX: LYMPHADENECTOMY: SHX5960

## 2019-12-11 HISTORY — PX: ROBOT ASSISTED LAPAROSCOPIC RADICAL PROSTATECTOMY: SHX5141

## 2019-12-11 LAB — HEMOGLOBIN AND HEMATOCRIT, BLOOD
HCT: 44.9 % (ref 39.0–52.0)
Hemoglobin: 15.2 g/dL (ref 13.0–17.0)

## 2019-12-11 SURGERY — PROSTATECTOMY, RADICAL, ROBOT-ASSISTED, LAPAROSCOPIC
Anesthesia: General

## 2019-12-11 MED ORDER — SODIUM CHLORIDE (PF) 0.9 % IJ SOLN
INTRAMUSCULAR | Status: AC
  Filled 2019-12-11: qty 20

## 2019-12-11 MED ORDER — DEXTROSE-NACL 5-0.45 % IV SOLN: INTRAVENOUS | Status: DC

## 2019-12-11 MED ORDER — HYDROMORPHONE HCL 1 MG/ML IJ SOLN
INTRAMUSCULAR | Status: AC
  Administered 2019-12-11: 0.5 mg via INTRAVENOUS
  Filled 2019-12-11: qty 1

## 2019-12-11 MED ORDER — BELLADONNA ALKALOIDS-OPIUM 16.2-60 MG RE SUPP: 1.0000 | Freq: Four times a day (QID) | RECTAL | Status: DC | PRN

## 2019-12-11 MED ORDER — SODIUM CHLORIDE 0.9 % IV BOLUS
1000.0000 mL | Freq: Once | INTRAVENOUS | Status: AC
  Administered 2019-12-11: 1000 mL via INTRAVENOUS

## 2019-12-11 MED ORDER — EPHEDRINE 5 MG/ML INJ
INTRAVENOUS | Status: AC
  Filled 2019-12-11: qty 10

## 2019-12-11 MED ORDER — ROCURONIUM BROMIDE 10 MG/ML (PF) SYRINGE
PREFILLED_SYRINGE | INTRAVENOUS | Status: DC | PRN
  Administered 2019-12-11: 70 mg via INTRAVENOUS
  Administered 2019-12-11: 20 mg via INTRAVENOUS

## 2019-12-11 MED ORDER — KETAMINE HCL 10 MG/ML IJ SOLN
INTRAMUSCULAR | Status: DC | PRN
  Administered 2019-12-11: 20 mg via INTRAVENOUS
  Administered 2019-12-11: 10 mg via INTRAVENOUS
  Administered 2019-12-11: 20 mg via INTRAVENOUS

## 2019-12-11 MED ORDER — PROPOFOL 10 MG/ML IV BOLUS
INTRAVENOUS | Status: AC
  Filled 2019-12-11: qty 20

## 2019-12-11 MED ORDER — LIDOCAINE 2% (20 MG/ML) 5 ML SYRINGE
INTRAMUSCULAR | Status: DC | PRN
  Administered 2019-12-11: 40 mg via INTRAVENOUS

## 2019-12-11 MED ORDER — DIPHENHYDRAMINE HCL 12.5 MG/5ML PO ELIX: 12.5000 mg | ORAL_SOLUTION | Freq: Four times a day (QID) | ORAL | Status: DC | PRN

## 2019-12-11 MED ORDER — ONDANSETRON HCL 4 MG/2ML IJ SOLN
INTRAMUSCULAR | Status: DC | PRN
  Administered 2019-12-11: 4 mg via INTRAVENOUS

## 2019-12-11 MED ORDER — PROPOFOL 10 MG/ML IV BOLUS
INTRAVENOUS | Status: DC | PRN
  Administered 2019-12-11: 200 mg via INTRAVENOUS

## 2019-12-11 MED ORDER — KETOROLAC TROMETHAMINE 30 MG/ML IJ SOLN: 30.0000 mg | Freq: Once | INTRAMUSCULAR | Status: DC | PRN

## 2019-12-11 MED ORDER — SULFAMETHOXAZOLE-TRIMETHOPRIM 800-160 MG PO TABS
1.0000 | ORAL_TABLET | Freq: Two times a day (BID) | ORAL | 0 refills | Status: AC
Start: 2019-12-11 — End: ?

## 2019-12-11 MED ORDER — LIDOCAINE 2% (20 MG/ML) 5 ML SYRINGE
INTRAMUSCULAR | Status: AC
  Filled 2019-12-11: qty 5

## 2019-12-11 MED ORDER — STERILE WATER FOR IRRIGATION IR SOLN
Status: DC | PRN
  Administered 2019-12-11: 1000 mL

## 2019-12-11 MED ORDER — SODIUM CHLORIDE (PF) 0.9 % IJ SOLN
INTRAMUSCULAR | Status: DC | PRN
  Administered 2019-12-11: 20 mL via INTRAVENOUS

## 2019-12-11 MED ORDER — DIPHENHYDRAMINE HCL 50 MG/ML IJ SOLN: 12.5000 mg | Freq: Four times a day (QID) | INTRAMUSCULAR | Status: DC | PRN

## 2019-12-11 MED ORDER — ORAL CARE MOUTH RINSE: 15.0000 mL | Freq: Once | OROMUCOSAL | Status: AC

## 2019-12-11 MED ORDER — DEXAMETHASONE SODIUM PHOSPHATE 10 MG/ML IJ SOLN
INTRAMUSCULAR | Status: AC
  Filled 2019-12-11: qty 1

## 2019-12-11 MED ORDER — ACETAMINOPHEN 500 MG PO TABS
1000.0000 mg | ORAL_TABLET | Freq: Four times a day (QID) | ORAL | Status: AC
  Administered 2019-12-11 – 2019-12-12 (×4): 1000 mg via ORAL
  Filled 2019-12-11 (×4): qty 2

## 2019-12-11 MED ORDER — FENTANYL CITRATE (PF) 100 MCG/2ML IJ SOLN
INTRAMUSCULAR | Status: AC
  Filled 2019-12-11: qty 2

## 2019-12-11 MED ORDER — CHLORHEXIDINE GLUCONATE CLOTH 2 % EX PADS
6.0000 | MEDICATED_PAD | Freq: Every day | CUTANEOUS | Status: DC
  Administered 2019-12-12: 6 via TOPICAL

## 2019-12-11 MED ORDER — ONDANSETRON HCL 4 MG/2ML IJ SOLN: 4.0000 mg | INTRAMUSCULAR | Status: DC | PRN

## 2019-12-11 MED ORDER — DOCUSATE SODIUM 100 MG PO CAPS
100.0000 mg | ORAL_CAPSULE | Freq: Two times a day (BID) | ORAL | Status: DC
  Administered 2019-12-11 – 2019-12-12 (×2): 100 mg via ORAL
  Filled 2019-12-11 (×2): qty 1

## 2019-12-11 MED ORDER — OXYCODONE HCL 5 MG PO TABS
5.0000 mg | ORAL_TABLET | ORAL | Status: DC | PRN
  Administered 2019-12-12 (×2): 5 mg via ORAL
  Filled 2019-12-11 (×2): qty 1

## 2019-12-11 MED ORDER — DEXAMETHASONE SODIUM PHOSPHATE 10 MG/ML IJ SOLN
INTRAMUSCULAR | Status: DC | PRN
  Administered 2019-12-11: 8 mg via INTRAVENOUS

## 2019-12-11 MED ORDER — HYDROMORPHONE HCL 1 MG/ML IJ SOLN
0.5000 mg | INTRAMUSCULAR | Status: DC | PRN
  Administered 2019-12-11: 1 mg via INTRAVENOUS
  Administered 2019-12-11: 0.5 mg via INTRAVENOUS
  Administered 2019-12-11: 1 mg via INTRAVENOUS
  Filled 2019-12-11 (×3): qty 1

## 2019-12-11 MED ORDER — MENTHOL 3 MG MT LOZG
1.0000 | LOZENGE | OROMUCOSAL | Status: DC | PRN
  Filled 2019-12-11: qty 9

## 2019-12-11 MED ORDER — FENTANYL CITRATE (PF) 250 MCG/5ML IJ SOLN
INTRAMUSCULAR | Status: DC | PRN
  Administered 2019-12-11: 25 ug via INTRAVENOUS
  Administered 2019-12-11 (×2): 50 ug via INTRAVENOUS
  Administered 2019-12-11: 100 ug via INTRAVENOUS

## 2019-12-11 MED ORDER — EPHEDRINE SULFATE-NACL 50-0.9 MG/10ML-% IV SOSY
PREFILLED_SYRINGE | INTRAVENOUS | Status: DC | PRN
  Administered 2019-12-11: 5 mg via INTRAVENOUS

## 2019-12-11 MED ORDER — LACTATED RINGERS IR SOLN
Status: DC | PRN
  Administered 2019-12-11: 1000 mL

## 2019-12-11 MED ORDER — SUGAMMADEX SODIUM 200 MG/2ML IV SOLN
INTRAVENOUS | Status: DC | PRN
  Administered 2019-12-11: 200 mg via INTRAVENOUS

## 2019-12-11 MED ORDER — ROCURONIUM BROMIDE 10 MG/ML (PF) SYRINGE
PREFILLED_SYRINGE | INTRAVENOUS | Status: AC
  Filled 2019-12-11: qty 10

## 2019-12-11 MED ORDER — BACITRACIN-NEOMYCIN-POLYMYXIN 400-5-5000 EX OINT: 1.0000 "application " | TOPICAL_OINTMENT | Freq: Three times a day (TID) | CUTANEOUS | Status: DC | PRN

## 2019-12-11 MED ORDER — KETAMINE HCL 10 MG/ML IJ SOLN
INTRAMUSCULAR | Status: AC
  Filled 2019-12-11: qty 1

## 2019-12-11 MED ORDER — MAGNESIUM CITRATE PO SOLN: 1.0000 | Freq: Once | ORAL | Status: DC

## 2019-12-11 MED ORDER — LACTATED RINGERS IV SOLN: INTRAVENOUS | Status: DC

## 2019-12-11 MED ORDER — CEFAZOLIN SODIUM-DEXTROSE 2-4 GM/100ML-% IV SOLN
2.0000 g | INTRAVENOUS | Status: AC
  Administered 2019-12-11: 2 g via INTRAVENOUS
  Filled 2019-12-11: qty 100

## 2019-12-11 MED ORDER — MIDAZOLAM HCL 2 MG/2ML IJ SOLN
INTRAMUSCULAR | Status: DC | PRN
  Administered 2019-12-11: 2 mg via INTRAVENOUS

## 2019-12-11 MED ORDER — ONDANSETRON HCL 4 MG/2ML IJ SOLN
INTRAMUSCULAR | Status: AC
  Filled 2019-12-11: qty 2

## 2019-12-11 MED ORDER — CHLORHEXIDINE GLUCONATE 0.12 % MT SOLN
15.0000 mL | Freq: Once | OROMUCOSAL | Status: AC
  Administered 2019-12-11: 15 mL via OROMUCOSAL

## 2019-12-11 MED ORDER — HYDROMORPHONE HCL 1 MG/ML IJ SOLN
0.2500 mg | INTRAMUSCULAR | Status: DC | PRN
  Administered 2019-12-11 (×2): 0.5 mg via INTRAVENOUS

## 2019-12-11 MED ORDER — HYDROCODONE-ACETAMINOPHEN 5-325 MG PO TABS: 1.0000 | ORAL_TABLET | Freq: Four times a day (QID) | ORAL | 0 refills | Status: AC | PRN

## 2019-12-11 MED ORDER — BUPIVACAINE LIPOSOME 1.3 % IJ SUSP
20.0000 mL | Freq: Once | INTRAMUSCULAR | Status: AC
  Administered 2019-12-11: 20 mL
  Filled 2019-12-11: qty 20

## 2019-12-11 MED ORDER — MIDAZOLAM HCL 2 MG/2ML IJ SOLN
INTRAMUSCULAR | Status: AC
  Filled 2019-12-11: qty 2

## 2019-12-11 MED ORDER — LIDOCAINE 20MG/ML (2%) 15 ML SYRINGE OPTIME
INTRAMUSCULAR | Status: DC | PRN
  Administered 2019-12-11: 1.5 mg/kg/h via INTRAVENOUS

## 2019-12-11 SURGICAL SUPPLY — 66 items
APPLICATOR COTTON TIP 6 STRL (MISCELLANEOUS) ×2 IMPLANT
APPLICATOR COTTON TIP 6IN STRL (MISCELLANEOUS) ×3
CATH FOLEY 2WAY SLVR 18FR 30CC (CATHETERS) ×3 IMPLANT
CATH TIEMANN FOLEY 18FR 5CC (CATHETERS) ×3 IMPLANT
CHLORAPREP W/TINT 26 (MISCELLANEOUS) ×3 IMPLANT
CLIP VESOLOCK LG 6/CT PURPLE (CLIP) ×6 IMPLANT
CNTNR URN SCR LID CUP LEK RST (MISCELLANEOUS) ×2 IMPLANT
CONT SPEC 4OZ STRL OR WHT (MISCELLANEOUS) ×2
COVER SURGICAL LIGHT HANDLE (MISCELLANEOUS) ×3 IMPLANT
COVER TIP SHEARS 8 DVNC (MISCELLANEOUS) ×2 IMPLANT
COVER TIP SHEARS 8MM DA VINCI (MISCELLANEOUS) ×1
COVER WAND RF STERILE (DRAPES) IMPLANT
CUTTER ECHEON FLEX ENDO 45 340 (ENDOMECHANICALS) ×3 IMPLANT
DECANTER SPIKE VIAL GLASS SM (MISCELLANEOUS) ×3 IMPLANT
DERMABOND ADVANCED (GAUZE/BANDAGES/DRESSINGS) ×1
DERMABOND ADVANCED .7 DNX12 (GAUZE/BANDAGES/DRESSINGS) ×2 IMPLANT
DRAIN CHANNEL RND F F (WOUND CARE) IMPLANT
DRAPE ARM DVNC X/XI (DISPOSABLE) ×8 IMPLANT
DRAPE COLUMN DVNC XI (DISPOSABLE) ×2 IMPLANT
DRAPE DA VINCI XI ARM (DISPOSABLE) ×4
DRAPE DA VINCI XI COLUMN (DISPOSABLE) ×1
DRAPE SURG IRRIG POUCH 19X23 (DRAPES) ×3 IMPLANT
DRSG TEGADERM 4X4.75 (GAUZE/BANDAGES/DRESSINGS) ×3 IMPLANT
DRSG TEGADERM 6X8 (GAUZE/BANDAGES/DRESSINGS) ×3 IMPLANT
ELECT REM PT RETURN 15FT ADLT (MISCELLANEOUS) ×3 IMPLANT
GAUZE 4X4 16PLY RFD (DISPOSABLE) IMPLANT
GAUZE SPONGE 2X2 8PLY STRL LF (GAUZE/BANDAGES/DRESSINGS) ×2 IMPLANT
GLOVE BIO SURGEON STRL SZ 6.5 (GLOVE) ×3 IMPLANT
GLOVE BIOGEL M STRL SZ7.5 (GLOVE) ×6 IMPLANT
GLOVE BIOGEL PI IND STRL 7.5 (GLOVE) ×2 IMPLANT
GLOVE BIOGEL PI INDICATOR 7.5 (GLOVE) ×1
GOWN STRL REUS W/TWL LRG LVL3 (GOWN DISPOSABLE) ×9 IMPLANT
HOLDER FOLEY CATH W/STRAP (MISCELLANEOUS) ×3 IMPLANT
IRRIG SUCT STRYKERFLOW 2 WTIP (MISCELLANEOUS) ×3
IRRIGATION SUCT STRKRFLW 2 WTP (MISCELLANEOUS) ×2 IMPLANT
IV LACTATED RINGERS 1000ML (IV SOLUTION) ×3 IMPLANT
KIT PROCEDURE DA VINCI SI (MISCELLANEOUS) ×2
KIT PROCEDURE DVNC SI (MISCELLANEOUS) ×2 IMPLANT
KIT TURNOVER KIT A (KITS) IMPLANT
NEEDLE INSUFFLATION 14GA 120MM (NEEDLE) ×3 IMPLANT
NEEDLE SPNL 22GX7 QUINCKE BK (NEEDLE) ×3 IMPLANT
PACK ROBOTIC CUSTOM UROLOGY (CUSTOM PROCEDURE TRAY) ×3 IMPLANT
PAD POSITIONING PINK XL (MISCELLANEOUS) ×3 IMPLANT
PENCIL SMOKE EVACUATOR (MISCELLANEOUS) IMPLANT
PORT ACCESS TROCAR AIRSEAL 12 (TROCAR) ×2 IMPLANT
PORT ACCESS TROCAR AIRSEAL 5M (TROCAR) ×1
SEAL CANN UNIV 5-8 DVNC XI (MISCELLANEOUS) ×8 IMPLANT
SEAL XI 5MM-8MM UNIVERSAL (MISCELLANEOUS) ×4
SET TRI-LUMEN FLTR TB AIRSEAL (TUBING) ×3 IMPLANT
SOLUTION ELECTROLUBE (MISCELLANEOUS) ×3 IMPLANT
SPONGE GAUZE 2X2 STER 10/PKG (GAUZE/BANDAGES/DRESSINGS) ×1
SPONGE LAP 4X18 RFD (DISPOSABLE) ×3 IMPLANT
STAPLE RELOAD 45 GRN (STAPLE) ×2 IMPLANT
STAPLE RELOAD 45MM GREEN (STAPLE) ×3
SUT ETHILON 3 0 PS 1 (SUTURE) ×3 IMPLANT
SUT MNCRL AB 4-0 PS2 18 (SUTURE) ×6 IMPLANT
SUT PDS AB 1 CT1 27 (SUTURE) ×6 IMPLANT
SUT VIC AB 2-0 SH 27 (SUTURE) ×2
SUT VIC AB 2-0 SH 27X BRD (SUTURE) ×2 IMPLANT
SUT VICRYL 0 UR6 27IN ABS (SUTURE) ×3 IMPLANT
SUT VLOC BARB 180 ABS3/0GR12 (SUTURE) ×9
SUTURE VLOC BRB 180 ABS3/0GR12 (SUTURE) ×6 IMPLANT
SYR 27GX1/2 1ML LL SAFETY (SYRINGE) ×3 IMPLANT
TOWEL OR NON WOVEN STRL DISP B (DISPOSABLE) ×3 IMPLANT
TROCAR XCEL NON-BLD 5MMX100MML (ENDOMECHANICALS) IMPLANT
WATER STERILE IRR 1000ML POUR (IV SOLUTION) ×3 IMPLANT

## 2019-12-11 NOTE — Brief Op Note (Signed)
12/11/2019  2:05 PM  PATIENT:  Hortense Ramal  59 y.o. male  PRE-OPERATIVE DIAGNOSIS:  PROSTATE CANCER  POST-OPERATIVE DIAGNOSIS:  PROSTATE CANCER  PROCEDURE:  Procedure(s) with comments: XI ROBOTIC ASSISTED LAPAROSCOPIC RADICAL PROSTATECTOMY (N/A) - 3 HRS LYMPHADENECTOMY (Bilateral)  SURGEON:  Surgeon(s) and Role:    * Alexis Frock, MD - Primary  PHYSICIAN ASSISTANT:   ASSISTANTS: Clemetine Marker PA    ANESTHESIA:   local and general  EBL:  100 mL   BLOOD ADMINISTERED:none  DRAINS: 1 - JP to bulb; 2 - Foley to gravity   LOCAL MEDICATIONS USED:  MARCAINE     SPECIMEN:  Source of Specimen:  1 - peri-prostatic fat; 2 - pelvic lymph nodes; 3 - prostatectomy  DISPOSITION OF SPECIMEN:  PATHOLOGY  COUNTS:  YES  TOURNIQUET:  * No tourniquets in log *  DICTATION: .Other Dictation: Dictation Number  [id number]  PLAN OF CARE: Admit for overnight observation  PATIENT DISPOSITION:  PACU - hemodynamically stable.   Delay start of Pharmacological VTE agent (>24hrs) due to surgical blood loss or risk of bleeding: yes

## 2019-12-11 NOTE — Discharge Instructions (Signed)

## 2019-12-11 NOTE — Transfer of Care (Signed)
Immediate Anesthesia Transfer of Care Note  Patient: Derek Fisher  Procedure(s) Performed: XI ROBOTIC ASSISTED LAPAROSCOPIC RADICAL PROSTATECTOMY (N/A ) LYMPHADENECTOMY (Bilateral )  Patient Location: PACU  Anesthesia Type:General  Level of Consciousness: awake, alert  and oriented  Airway & Oxygen Therapy: Patient Spontanous Breathing and Patient connected to face mask oxygen  Post-op Assessment: Report given to RN and Post -op Vital signs reviewed and stable  Post vital signs: Reviewed and stable  Last Vitals:  Vitals Value Taken Time  BP 160/107 12/11/19 1416  Temp    Pulse 91 12/11/19 1418  Resp 18 12/11/19 1418  SpO2 100 % 12/11/19 1418  Vitals shown include unvalidated device data.  Last Pain:  Vitals:   12/11/19 1022  TempSrc:   PainSc: 0-No pain      Patients Stated Pain Goal: 4 (38/88/75 7972)  Complications: No apparent anesthesia complications

## 2019-12-11 NOTE — Op Note (Signed)
NAME: Derek Fisher, Derek Fisher MEDICAL RECORD [id number] ACCOUNT [id number] DATE OF BIRTH:02/26/1961 FACILITY: [facility] LOCATION: [id number] PHYSICIAN:Zavian Slowey Tresa Moore, MD  OPERATIVE REPORT  DATE OF PROCEDURE:  12/11/2019  SURGEON:  Alexis Frock, MD  PREOPERATIVE DIAGNOSIS:  Moderate risk prostate cancer.  PROCEDURE:  Robotic-assisted laparoscopic radical prostatectomy with bilateral pelvic lymphadenectomy.  ESTIMATED BLOOD LOSS:  100 mL.  COMPLICATIONS:  None.  SPECIMENS: 1.  Periprosthetic fat. 2.  Right external iliac lymph nodes. 3.  Right obturator lymph nodes. 4.  Left external iliac lymph nodes. 5.  Left obturator lymph nodes. 6.  Radical prostatectomy.  ASSISTANT:  Amanda L. Dancy, PA  DRAINS:   1.  Jackson-Pratt drain to bulb suction. 2.  Foley catheter to straight drain.  FINDINGS: 1.  Prior right inguinal hernia repair.  No evidence of persistent hernia. 2.  No obvious sentinel lymph nodes within the pelvis with ICG administration.  INDICATIONS:  The patient is a very pleasant and vigorous 59 year old man who was found on workup of elevated and rising PSA to have multifocal adenocarcinoma of the prostate.  Options were discussed for management, including surveillance protocols  versus ablative therapy versus surgical extirpation and he wished to proceed with prostatectomy with curative intent.  Informed consent was obtained and placed in the medical record.  DESCRIPTION OF PROCEDURE:  The patient being identified, the procedure being radical prostatectomy with node dissection was confirmed.  Procedure timeout was performed.  Intravenous antibiotics were administered.  General endotracheal anesthesia induced.   The patient was placed into a low lithotomy position, sterile field was created, prepping and draping his penis, perineum and proximal thighs using iodine and his infra-xiphoid abdomen using chlorhexidine gluconate after clipper shaving and further  fashioning to the  operative table using 3-inch tape over foam padding across the supraxiphoid chest.  A test of steep Trendelenburg positioning was performed and found to be suitably positioned.  Foley catheter was placed free to straight drain.  A  high-flow, low-pressure pneumoperitoneum was obtained using Veress technique in the supraumbilical midline, having passed the aspiration and drop test.  An 8 mm robotic camera port was then placed in same location.  Laparoscopic examination of the  peritoneal cavity showed no significant adhesions, no visceral injury.  There was questionable persistent right inguinal hernia to be inspected the later.  Distal ports were placed as follows:  Right paramedian 8 mm robotic port, right far lateral 12 mm  AirSeal assist port, right superior paramedian 5 mm suction port, left paramedian 8 mm robotic port, left far lateral 8 mm robotic port.  Robot was docked and passed the electronic checks.  Next, attention was directed at development of the space of  Retzius.  Incision was made lateral to right medial umbilical ligament from the midline towards the area of the internal ring, coursing along the iliac vessels towards the area of the right ureter, which was positively identified.  The right vas deferens  was encountered, ligated and using medial bucket handle and the lateral bladder wall was separated from the pelvic sidewall towards the area of the endopelvic fascia on the right side.  A mirror image dissection was performed on the left side.  Having  developed the space of Retzius, the right inguinal area was further inspected and found to be that there was a slight fascial defect at the level of the internal fascia.  However, this did not communicate to the scrotum and he appeared to have had  successful open approach hernia repair in the  past.  This was not persistent, not felt to need for further management.  The anterior base of the prostate area was identified and defatted to further  denote the bladder neck-prostate junction.  This tissue  was set aside and labeled periprosthetic fat.  Next, 0.2 mL of indocyanine green dye was injected in each lobe of the prostate using a percutaneously placed robotically-guided spinal needle with intervening suctioning to prevent dye spillage.  Next, the  endopelvic fascia was carefully swept away from the lateral aspect of the prostate in a base to apex orientation bilaterally.  This exposed the dorsal venous complex which was controlled using a green load stapler, taking exquisite care to avoid  membranous urethral injury, which did not occur.  Then, approximately 10 minutes post-dye injection, the pelvis was inspected under near infrared fluorescence light.  Sentinel lymphangiography revealed good parenchymal uptake of the prostate and several small lymphatic channels coursing towards the area of the lymph node fields; however, there were no obvious sentinel lymph nodes within these boundaries.  As such  standard, template lymphadenectomy was performed on the right side.  First, the right external iliac group with boundaries being right external iliac artery, vein, pelvic sidewall, iliac bifurcation.  Lymphostasis achieved with cold clips.  Next, the  right obturator group was dissected free with the boundaries being right external iliac vein, pelvic sidewall, obturator nerve.  Lymphostasis achieved with cold clips, set aside labeled right obturator lymph nodes.  Obturator nerve was inspected  following these maneuvers and found to be uninjured.  A mirror image lymphadenectomy was performed on the left side of the left external iliac and left obturator group respectively.  Left obturator nerve was also inspected and found to be uninjured.   Next, attention was directed at bladder neck dissection.  The bladder neck was identified by moving the Foley catheter back and forth in the anterior plane and a lateral release was performed on each side to  better denote the bladder neck-prostate  junction, which was then separated in an anterior, posterior direction, keeping what appeared to be a rim of circular muscle fibers at each plane of dissection.  There was some question of a small median lobe based on his prior ultrasound imaging.    However, direct inspection of the posterior bladder neck area revealed no median lobe.  This was likely felt to be just folding of his prior bladder neck area.  Posterior dissection was then performed by incising approximately 7 mm inferoposterior to the  posterior lip of the prostate and entering the plane of Denonvilliers.  Bilateral vas deferens were encountered, dissected for a distance of approximately 4 cm, ligated and placed on gentle superior traction.  Bilateral seminal vesicles were dissected  to their tips and placed on gentle superior traction.  Dissection proceeded inferiorly towards the area of the apex of the prostate.  This exposed the vascular pedicles on each side, which were controlled using a sequential clipping technique in a base  to apex orientation, performing moderate nerve sparing bilaterally.  Next, the prostate was placed on gentle superior traction and the final apical dissection was performed in the anterior plane, transecting the membranous urethra coldly.  This  completely freed up the prostatectomy specimen, which was placed into an EndoCatch bag for later retrieval.  Next, posterior dissection was performed using a 3-0 V-Loc suture, reapproximating the posterior urethral plate the posterior bladder neck,  bringing the structures into tension-free apposition.  Next mucosa-to-mucosa anastomosis was performed using a  double arm 3-0 V-Loc suture from the 6 o'clock to the 12 o'clock position, which resulted in excellent tension-free apposition.  A new Foley  catheter was then placed per urethra, which irrigated quantitatively.  Sponge and needle counts were correct.  Hemostasis appeared  excellent.  We achieved the goals of the surgery today.  A closed suction drain was brought out to the previous left  lateral most robotic port site into the area of the peritoneal cavity.  The robot was then undocked.  Specimen was retrieved by extending the previous camera port site inferiorly for a total distance of approximately 3 cm, removing the prostatectomy  specimen, setting it aside for permanent pathology.  This site was closed at the level of the fascia using figure-of-eight PDS x3, followed by reapproximation of Scarpa's with a running Vicryl.  All incision sites were infiltrated with dilute lipolyzed  Marcaine and closed at the level of the  skin using subcuticular Monocryl and Dermabond.  Procedure terminated.  The patient tolerated the procedure well.  No immediate complications.  The patient was taken to postanesthesia care in stable condition.   Please note, first assistant Debbrah Alar was crucial for all portions of the surgery today.  She provided invaluable retraction, specimen manipulation, vascular stapling, lymphatic clipping, and general first assistance.  VN/NUANCE  D:12/11/2019 T:12/11/2019 JOB:[id number]

## 2019-12-11 NOTE — Anesthesia Procedure Notes (Signed)
Procedure Name: Intubation Date/Time: 12/11/2019 11:57 AM Performed by: Eben Burow, CRNA Pre-anesthesia Checklist: Patient identified, Emergency Drugs available, Suction available, Patient being monitored and Timeout performed Patient Re-evaluated:Patient Re-evaluated prior to induction Oxygen Delivery Method: Circle system utilized Preoxygenation: Pre-oxygenation with 100% oxygen Induction Type: IV induction Ventilation: Mask ventilation without difficulty Laryngoscope Size: Mac and 4 Grade View: Grade I Tube type: Oral Tube size: 7.5 mm Number of attempts: 1 Airway Equipment and Method: Stylet Placement Confirmation: ETT inserted through vocal cords under direct vision,  positive ETCO2 and breath sounds checked- equal and bilateral Secured at: 23 cm Tube secured with: Tape Dental Injury: Teeth and Oropharynx as per pre-operative assessment

## 2019-12-11 NOTE — H&P (Signed)
Derek Fisher is an 59 y.o. male.    Chief Complaint: Pre-Op Prostatectomy  HPI:   1 - Moderate Risk Prostate Cancer - 1 core 40% grade 2 (LMA) and 2 cores grade 1 (RML, RMB) on eval increasing PSA to 5.2 TRUS 40 mL with some left lateran mild hypoechoic, slight median lobe.    PMH sig for anxiety/depression, back surgery x several (no residual deficits), colon polyps (5 year colonoscopy cycle). He works in [employer] for [employer]. No CV disease. No strong blood thinners.    Today Derek Fisher is seen to proceed with prostatectomy. No interval fefers.    Past Medical History:  Diagnosis Date  . Headache    rare  . Prostate cancer [facility])     Past Surgical History:  Procedure Laterality Date  . BACK SURGERY  2011,2001   ruptured disc and neck  . COLONOSCOPY  2018  . FRACTURE SURGERY Right 2010  . PROSTATE BIOPSY    . SURGERY SCROTAL / TESTICULAR  1991  . TONSILLECTOMY     as a child    Family History  Problem Relation Age of Onset  . Breast cancer Neg Hx   . Prostate cancer Neg Hx   . Pancreatic cancer Neg Hx   . Colon cancer Neg Hx    Social History:  reports that he has never smoked. He has never used smokeless tobacco. He reports current alcohol use of about 8.0 standard drinks of alcohol per week. He reports that he does not use drugs.  Allergies: No Known Allergies  No medications prior to admission.    No results found for this or any previous visit (from the past 48 hour(s)). No results found.  Review of Systems  Constitutional: Negative for chills and fever.  All other systems reviewed and are negative.   There were no vitals taken for this visit. Physical Exam  Constitutional: He appears well-developed.  HENT:  Head: Normocephalic.  Eyes: Pupils are equal, round, and reactive to light.  Cardiovascular: Normal rate.  Respiratory: Effort normal.  GI: Soft.  Genitourinary:    Genitourinary Comments: No CVAT   Musculoskeletal:        General:  Normal range of motion.     Cervical back: Normal range of motion.  Neurological: He is alert.  Skin: Skin is warm.  Psychiatric: He has a normal mood and affect.     Assessment/Plan Proceed as planned with prostatectomy + ICG + nodes with curative intent. Risks, benefits, alternatives, expected peri-op course discussed previously and reiterated today.   Alexis Frock, MD 12/11/2019, 8:19 AM

## 2019-12-11 NOTE — Anesthesia Preprocedure Evaluation (Signed)
Anesthesia Evaluation  Patient identified by MRN, date of birth, ID band Patient awake    Reviewed: Allergy & Precautions, NPO status , Patient's Chart, lab work & pertinent test results  Airway Mallampati: II  TM Distance: >3 FB Neck ROM: Full    Dental no notable dental hx.    Pulmonary neg pulmonary ROS,    Pulmonary exam normal breath sounds clear to auscultation       Cardiovascular negative cardio ROS Normal cardiovascular exam Rhythm:Regular Rate:Normal     Neuro/Psych negative neurological ROS  negative psych ROS   GI/Hepatic negative GI ROS, Neg liver ROS,   Endo/Other  negative endocrine ROS  Renal/GU negative Renal ROS  negative genitourinary   Musculoskeletal  (+) Osteoarthritis,    Abdominal   Peds negative pediatric ROS (+)  Hematology negative hematology ROS (+)   Anesthesia Other Findings   Reproductive/Obstetrics negative OB ROS                             Anesthesia Physical Anesthesia Plan  ASA: II  Anesthesia Plan: General   Post-op Pain Management:    Induction: Intravenous  PONV Risk Score and Plan: 2 and Ondansetron, Dexamethasone and Treatment may vary due to age or medical condition  Airway Management Planned: Oral ETT  Additional Equipment:   Intra-op Plan:   Post-operative Plan: Extubation in OR  Informed Consent: I have reviewed the patients History and Physical, chart, labs and discussed the procedure including the risks, benefits and alternatives for the proposed anesthesia with the patient or authorized representative who has indicated his/her understanding and acceptance.     Dental advisory given  Plan Discussed with: CRNA and Surgeon  Anesthesia Plan Comments:         Anesthesia Quick Evaluation

## 2019-12-11 NOTE — Anesthesia Postprocedure Evaluation (Signed)
Anesthesia Post Note  Patient: Derek Fisher  Procedure(s) Performed: XI ROBOTIC ASSISTED LAPAROSCOPIC RADICAL PROSTATECTOMY (N/A ) LYMPHADENECTOMY (Bilateral )     Patient location during evaluation: PACU Anesthesia Type: General Level of consciousness: awake and alert Pain management: pain level controlled Vital Signs Assessment: post-procedure vital signs reviewed and stable Respiratory status: spontaneous breathing, nonlabored ventilation, respiratory function stable and patient connected to nasal cannula oxygen Cardiovascular status: blood pressure returned to baseline and stable Postop Assessment: no apparent nausea or vomiting Anesthetic complications: no    Last Vitals:  Vitals:   12/11/19 1530 12/11/19 1559  BP: 139/83 (!) 151/92  Pulse: 87 93  Resp: 18 19  Temp:  36.6 C  SpO2: 99% 99%    Last Pain:  Vitals:   12/11/19 1610  TempSrc:   PainSc: 7                  Christopher Glasscock S

## 2019-12-12 ENCOUNTER — Encounter (HOSPITAL_COMMUNITY): Payer: Self-pay | Admitting: Urology

## 2019-12-12 DIAGNOSIS — C61 Malignant neoplasm of prostate: Secondary | ICD-10-CM | POA: Diagnosis not present

## 2019-12-12 LAB — BASIC METABOLIC PANEL
Anion gap: 9 (ref 5–15)
BUN: 8 mg/dL (ref 6–20)
CO2: 25 mmol/L (ref 22–32)
Calcium: 8.9 mg/dL (ref 8.9–10.3)
Chloride: 100 mmol/L (ref 98–111)
Creatinine, Ser: 0.73 mg/dL (ref 0.61–1.24)
GFR calc Af Amer: >60 mL/min (ref >60)
GFR calc non Af Amer: >60 mL/min (ref >60)
Glucose, Bld: 135 mg/dL — ABNORMAL HIGH (ref 70–99)
Potassium: 5 mmol/L (ref 3.5–5.1)
Sodium: 134 mmol/L — ABNORMAL LOW (ref 135–145)

## 2019-12-12 LAB — HEMOGLOBIN AND HEMATOCRIT, BLOOD
HCT: 39.3 % (ref 39.0–52.0)
Hemoglobin: 13.3 g/dL (ref 13.0–17.0)

## 2019-12-12 LAB — CREATININE, FLUID (PLEURAL, PERITONEAL, JP DRAINAGE): Creat, Fluid: 0.6 mg/dL

## 2019-12-12 NOTE — Consult Note (Signed)
I when to visit with pt who was alert and aware sitting up in bed sufficient other was present.  Pt states he has no needs at present. I offered caring presence. No follow-up was requested.

## 2019-12-12 NOTE — Progress Notes (Signed)
Pt discharged home today per Dr. Manny. Pt's IV site D/C'd and WDL. Pt's VSS. Pt provided with home medication list, discharge instructions and prescriptions. Verbalized understanding. Pt left floor via WC in stable condition accompanied by NT.  

## 2019-12-12 NOTE — Plan of Care (Signed)
  Problem: Education: Goal: Knowledge of General Education information will improve Description: Including pain rating scale, medication(s)/side effects and non-pharmacologic comfort measures Outcome: Completed/Met   Problem: Health Behavior/Discharge Planning: Goal: Ability to manage health-related needs will improve Outcome: Progressing   Problem: Clinical Measurements: Goal: Ability to maintain clinical measurements within normal limits will improve Outcome: Progressing Goal: Will remain free from infection Outcome: Progressing Goal: Diagnostic test results will improve Outcome: Progressing Goal: Respiratory complications will improve Outcome: Progressing Goal: Cardiovascular complication will be avoided Outcome: Completed/Met   Problem: Activity: Goal: Risk for activity intolerance will decrease Outcome: Progressing   Problem: Nutrition: Goal: Adequate nutrition will be maintained Outcome: Progressing   Problem: Coping: Goal: Level of anxiety will decrease Outcome: Completed/Met   Problem: Elimination: Goal: Will not experience complications related to bowel motility Outcome: Progressing Goal: Will not experience complications related to urinary retention Outcome: Progressing   Problem: Pain Managment: Goal: General experience of comfort will improve Outcome: Progressing   Problem: Safety: Goal: Ability to remain free from injury will improve Outcome: Progressing   Problem: Skin Integrity: Goal: Risk for impaired skin integrity will decrease Outcome: Progressing   Problem: Education: Goal: Knowledge of the procedure and recovery process will improve Outcome: Completed/Met   Problem: Bowel/Gastric: Goal: Gastrointestinal status for postoperative course will improve Outcome: Progressing   Problem: Pain Management: Goal: General experience of comfort will improve Outcome: Progressing   Problem: Skin Integrity: Goal: Demonstration of wound healing  without infection will improve Outcome: Progressing   Problem: Urinary Elimination: Goal: Ability to avoid or minimize complications of infection will improve Outcome: Progressing Goal: Ability to achieve and maintain urine output will improve Outcome: Progressing Goal: Home care management will improve Outcome: Progressing

## 2019-12-12 NOTE — Plan of Care (Signed)
  Problem: Education: Goal: Knowledge of General Education information will improve Description: Including pain rating scale, medication(s)/side effects and non-pharmacologic comfort measures Outcome: Completed/Met   Problem: Health Behavior/Discharge Planning: Goal: Ability to manage health-related needs will improve 12/12/2019 1909 by Annie Sable, RN Outcome: Completed/Met 12/12/2019 0851 by Annie Sable, RN Outcome: Progressing   Problem: Clinical Measurements: Goal: Ability to maintain clinical measurements within normal limits will improve 12/12/2019 1909 by Annie Sable, RN Outcome: Completed/Met 12/12/2019 0851 by Annie Sable, RN Outcome: Progressing Goal: Will remain free from infection 12/12/2019 1909 by Annie Sable, RN Outcome: Completed/Met 12/12/2019 0851 by Annie Sable, RN Outcome: Progressing Goal: Diagnostic test results will improve 12/12/2019 1909 by Annie Sable, RN Outcome: Completed/Met 12/12/2019 0851 by Annie Sable, RN Outcome: Progressing Goal: Respiratory complications will improve 12/12/2019 1909 by Annie Sable, RN Outcome: Completed/Met 12/12/2019 0851 by Annie Sable, RN Outcome: Progressing Goal: Cardiovascular complication will be avoided Outcome: Completed/Met   Problem: Activity: Goal: Risk for activity intolerance will decrease 12/12/2019 1909 by Annie Sable, RN Outcome: Completed/Met 12/12/2019 0851 by Annie Sable, RN Outcome: Progressing   Problem: Nutrition: Goal: Adequate nutrition will be maintained 12/12/2019 1909 by Annie Sable, RN Outcome: Completed/Met 12/12/2019 0851 by Annie Sable, RN Outcome: Progressing   Problem: Coping: Goal: Level of anxiety will decrease Outcome: Completed/Met   Problem: Elimination: Goal: Will not experience complications related to bowel motility 12/12/2019 1909 by Annie Sable, RN Outcome: Completed/Met 12/12/2019 0851 by Annie Sable, RN Outcome:  Progressing Goal: Will not experience complications related to urinary retention 12/12/2019 1909 by Annie Sable, RN Outcome: Completed/Met 12/12/2019 0851 by Annie Sable, RN Outcome: Progressing   Problem: Pain Managment: Goal: General experience of comfort will improve 12/12/2019 1909 by Annie Sable, RN Outcome: Completed/Met 12/12/2019 0851 by Annie Sable, RN Outcome: Progressing   Problem: Safety: Goal: Ability to remain free from injury will improve 12/12/2019 1909 by Annie Sable, RN Outcome: Completed/Met 12/12/2019 0851 by Annie Sable, RN Outcome: Progressing   Problem: Skin Integrity: Goal: Risk for impaired skin integrity will decrease 12/12/2019 1909 by Annie Sable, RN Outcome: Completed/Met 12/12/2019 0851 by Annie Sable, RN Outcome: Progressing   Problem: Education: Goal: Knowledge of the procedure and recovery process will improve Outcome: Completed/Met   Problem: Bowel/Gastric: Goal: Gastrointestinal status for postoperative course will improve 12/12/2019 1909 by Annie Sable, RN Outcome: Completed/Met 12/12/2019 0851 by Annie Sable, RN Outcome: Progressing   Problem: Pain Management: Goal: General experience of comfort will improve 12/12/2019 1909 by Annie Sable, RN Outcome: Completed/Met 12/12/2019 0851 by Annie Sable, RN Outcome: Progressing   Problem: Skin Integrity: Goal: Demonstration of wound healing without infection will improve 12/12/2019 1909 by Annie Sable, RN Outcome: Completed/Met 12/12/2019 0851 by Annie Sable, RN Outcome: Progressing   Problem: Urinary Elimination: Goal: Ability to avoid or minimize complications of infection will improve 12/12/2019 1909 by Annie Sable, RN Outcome: Completed/Met 12/12/2019 0851 by Annie Sable, RN Outcome: Progressing Goal: Ability to achieve and maintain urine output will improve 12/12/2019 1909 by Annie Sable, RN Outcome:  Completed/Met 12/12/2019 0851 by Annie Sable, RN Outcome: Progressing Goal: Home care management will improve 12/12/2019 1909 by Annie Sable, RN Outcome: Completed/Met 12/12/2019 0851 by Annie Sable, RN Outcome: Progressing

## 2019-12-12 NOTE — Discharge Summary (Signed)
Physician Discharge Summary  Patient ID: Derek Fisher MRN: [id number] DOB/AGE: 10/28/1960 59 y.o.  Admit date: 12/11/2019 Discharge date: 12/12/2019  Admission Diagnoses: Prostate Cancer  Discharge Diagnoses:  Active Problems:   Prostate cancer [facility])   Discharged Condition: good  Hospital Course: Pt underwent robotic prostatectomy with node dissection on 12/11/19 without acute complication.  Placed in 4th floor Urology observation unit post-op. By the afternoon of POD 1 he is ambulatory, pain controlled on PO meds, maintaining PO nutrition/hydration and felt to be adequate for discharge. JP removed as Cr same as serum. Hgb 13.3. Path pending.   Consults: None  Significant Diagnostic Studies: labs: as per above  Treatments: surgery: as per above  Discharge Exam: Blood pressure 138/81, pulse 87, temperature 98.3 F (36.8 C), temperature source Oral, resp. rate 18, height 6\' 2"  (1.88 m), weight 93.2 kg, SpO2 98 %. General appearance: alert and cooperative Eyes: negative Nose: Nares normal. Septum midline. Mucosa normal. No drainage or sinus tenderness. Throat: lips, mucosa, and tongue normal; teeth and gums normal Neck: supple, symmetrical, trachea midline Back: symmetric, no curvature. ROM normal. No CVA tenderness. Resp: non-labored on RA Cardio: Nl rate GI: soft, non-tender; bowel sounds normal; no masses,  no organomegaly and recent surgical sites c/d/i. JP removed and dry dressing applied.  Male genitalia: normal, foley in place with medium yellow urine.  Extremities: extremities normal, atraumatic, no cyanosis or edema Pulses: 2+ and symmetric Skin: Skin color, texture, turgor normal. No rashes or lesions Lymph nodes: Cervical, supraclavicular, and axillary nodes normal. Neurologic: Grossly normal  Disposition:    Allergies as of 12/12/2019   No Known Allergies     Medication List    STOP taking these medications   Aleve 220 MG tablet Generic drug: naproxen  sodium   multivitamin with minerals Tabs tablet   sildenafil 20 MG tablet Commonly known as: REVATIO     TAKE these medications   CLARITIN-D 12 HOUR PO Take 1 tablet by mouth daily as needed (allergies).   HYDROcodone-acetaminophen 5-325 MG tablet Commonly known as: Norco Take 1-2 tablets by mouth every 6 (six) hours as needed.   sulfamethoxazole-trimethoprim 800-160 MG tablet Commonly known as: BACTRIM DS Take 1 tablet by mouth 2 (two) times daily. Start the day prior to foley removal appointment       Follow-up Information    Alexis Frock, MD On 12/23/2019.   Specialty: Urology Why: at 9:30 for MD visit and catheter removal.  Contact information: [place] [id number] [phone]               Signed: Alexis Frock 12/12/2019, 5:39 PM

## 2019-12-17 LAB — SURGICAL PATHOLOGY
